# Patient Record
Sex: Female | Born: 1980 | Race: Black or African American | Hispanic: No | Marital: Single | Smoking: Current every day smoker
Health system: Southern US, Community
[De-identification: ages and names within clinical notes are randomized; demographics above are authoritative.]

## PROBLEM LIST (undated history)

## (undated) DIAGNOSIS — Z803 Family history of malignant neoplasm of breast: Secondary | ICD-10-CM

## (undated) DIAGNOSIS — D563 Thalassemia minor: Secondary | ICD-10-CM

## (undated) DIAGNOSIS — R519 Headache, unspecified: Secondary | ICD-10-CM

## (undated) DIAGNOSIS — Z800 Family history of malignant neoplasm of digestive organs: Secondary | ICD-10-CM

## (undated) DIAGNOSIS — J45909 Unspecified asthma, uncomplicated: Secondary | ICD-10-CM

## (undated) HISTORY — PX: DILATION AND CURETTAGE OF UTERUS: SHX78

## (undated) HISTORY — DX: Thalassemia minor: D56.3

## (undated) HISTORY — DX: Family history of malignant neoplasm of breast: Z80.3

## (undated) HISTORY — DX: Family history of malignant neoplasm of digestive organs: Z80.0

---

## 2016-08-11 ENCOUNTER — Emergency Department (HOSPITAL_COMMUNITY): Payer: Medicaid Other

## 2016-08-11 ENCOUNTER — Emergency Department (HOSPITAL_COMMUNITY)
Admission: EM | Admit: 2016-08-11 | Discharge: 2016-08-11 | Disposition: A | Discharge status: Home / Self Care | Payer: Medicaid Other | Attending: Emergency Medicine | Admitting: Emergency Medicine

## 2016-08-11 ENCOUNTER — Encounter (HOSPITAL_COMMUNITY): Payer: Self-pay | Admitting: Emergency Medicine

## 2016-08-11 DIAGNOSIS — J45909 Unspecified asthma, uncomplicated: Secondary | ICD-10-CM | POA: Diagnosis not present

## 2016-08-11 DIAGNOSIS — R0789 Other chest pain: Secondary | ICD-10-CM | POA: Diagnosis not present

## 2016-08-11 DIAGNOSIS — R079 Chest pain, unspecified: Secondary | ICD-10-CM | POA: Diagnosis present

## 2016-08-11 DIAGNOSIS — F17210 Nicotine dependence, cigarettes, uncomplicated: Secondary | ICD-10-CM | POA: Diagnosis not present

## 2016-08-11 HISTORY — DX: Unspecified asthma, uncomplicated: J45.909

## 2016-08-11 LAB — COMPREHENSIVE METABOLIC PANEL
ALK PHOS: 58 U/L (ref 38–126)
ALT: 13 U/L — AB (ref 14–54)
ANION GAP: 9 (ref 5–15)
AST: 17 U/L (ref 15–41)
Albumin: 4.1 g/dL (ref 3.5–5.0)
BILIRUBIN TOTAL: 0.3 mg/dL (ref 0.3–1.2)
BUN: 11 mg/dL (ref 6–20)
CHLORIDE: 111 mmol/L (ref 101–111)
CO2: 22 mmol/L (ref 22–32)
Calcium: 9.3 mg/dL (ref 8.9–10.3)
Creatinine, Ser: 0.93 mg/dL (ref 0.44–1.00)
GFR calc Af Amer: >60 mL/min (ref >60)
GFR calc non Af Amer: >60 mL/min (ref >60)
GLUCOSE: 98 mg/dL (ref 65–99)
POTASSIUM: 3.8 mmol/L (ref 3.5–5.1)
Sodium: 142 mmol/L (ref 135–145)
TOTAL PROTEIN: 7.4 g/dL (ref 6.5–8.1)

## 2016-08-11 LAB — CBC WITH DIFFERENTIAL/PLATELET
Basophils Absolute: 0 10*3/uL (ref 0.0–0.1)
Basophils Relative: 0 %
EOS PCT: 1 %
Eosinophils Absolute: 0.1 10*3/uL (ref 0.0–0.7)
HCT: 36.6 % (ref 36.0–46.0)
HEMOGLOBIN: 11.7 g/dL — AB (ref 12.0–15.0)
LYMPHS ABS: 2.5 10*3/uL (ref 0.7–4.0)
LYMPHS PCT: 36 %
MCH: 24 pg — AB (ref 26.0–34.0)
MCHC: 32 g/dL (ref 30.0–36.0)
MCV: 75 fL — AB (ref 78.0–100.0)
Monocytes Absolute: 0.5 10*3/uL (ref 0.1–1.0)
Monocytes Relative: 7 %
NEUTROS ABS: 3.8 10*3/uL (ref 1.7–7.7)
NEUTROS PCT: 56 %
Platelets: 267 10*3/uL (ref 150–400)
RBC: 4.88 MIL/uL (ref 3.87–5.11)
RDW: 15.3 % (ref 11.5–15.5)
WBC: 6.9 10*3/uL (ref 4.0–10.5)

## 2016-08-11 LAB — D-DIMER, QUANTITATIVE: D-Dimer, Quant: 0.48 ug/mL-FEU (ref 0.00–0.50)

## 2016-08-11 LAB — I-STAT TROPONIN, ED
Troponin i, poc: 0 ng/mL (ref 0.00–0.08)
Troponin i, poc: 0 ng/mL (ref 0.00–0.08)

## 2016-08-11 MED ORDER — IBUPROFEN 400 MG PO TABS: 400.0000 mg | ORAL_TABLET | Freq: Four times a day (QID) | ORAL | 0 refills | Status: DC | PRN

## 2016-08-11 MED ORDER — ACETAMINOPHEN 325 MG PO TABS
ORAL_TABLET | ORAL | Status: AC
  Administered 2016-08-11: 650 mg via ORAL
  Filled 2016-08-11: qty 2

## 2016-08-11 MED ORDER — ACETAMINOPHEN 325 MG PO TABS
650.0000 mg | ORAL_TABLET | Freq: Once | ORAL | Status: AC
  Administered 2016-08-11: 650 mg via ORAL

## 2016-08-11 MED ORDER — ACETAMINOPHEN 500 MG PO TABS: 500.0000 mg | ORAL_TABLET | Freq: Four times a day (QID) | ORAL | 0 refills | Status: DC | PRN

## 2016-08-11 NOTE — ED Notes (Signed)
Pt provided with d/c instructions at this time. Pt verbalizes understanding of d/c instructions as well as follow up procedure after d/c.  Pt provided with RX for tylenol and ibuprofen at this time.  Pt verbalizes understanding of RX directions. Pt in no apparent distress at this time. Pt ambulatory at time of d/c.

## 2016-08-11 NOTE — ED Triage Notes (Addendum)
Reports chest pain, shortness of breathe upon rest, pain worse with movement and deep inspiration, denies fevers. Smoker, on depo. All started yesterday. Has not had period since pregnant- 6 mos postpartum

## 2016-08-11 NOTE — ED Notes (Signed)
Pt's vitals updated and pt stated she was given Tylenol when she got here and she said that it didn't help. Pt is breast feeding. RN made aware that meds didn't help

## 2016-08-11 NOTE — ED Provider Notes (Signed)
[facility] Provider Note   CSN: [id number] Arrival date & time: 08/11/16  1355     History   Chief Complaint Chief Complaint  Patient presents with  . Chest Pain    HPI Nicole Lester is a 35 y.o. female.  HPI   Chest hurts every time breath in, it hurts severely.  Started day before yesterday. At first thought it was pulled muscle, slept wrong. Tightness with breathing from right side to back. Made sure to stretch, laid on other side the next night and woke up and it was worse.  Couldn't sit down, lay down, whole right side worse.   PPD today  Past Medical History:  Diagnosis Date  . Asthma     There are no active problems to display for this patient.   History reviewed. No pertinent surgical history.  OB History    No data available       Home Medications    Prior to Admission medications   Medication Sig Start Date End Date Taking? Authorizing Provider  MedroxyPROGESTERone Acetate 150 MG/ML SUSY Inject 150 mg as directed every 3 (three) months.  05/19/16  Yes Historical Provider, MD  Multiple Vitamin (MULTIVITAMIN) tablet Take 1 tablet by mouth daily.   Yes Historical Provider, MD  acetaminophen (TYLENOL) 500 MG tablet Take 1 tablet (500 mg total) by mouth every 6 (six) hours as needed. 08/11/16   Alvira MondayErin Ivianna Notch, MD  ibuprofen (ADVIL,MOTRIN) 400 MG tablet Take 1 tablet (400 mg total) by mouth every 6 (six) hours as needed. 08/11/16   Alvira MondayErin [place] Doig, MD    Family History History reviewed. No pertinent family history.  Social History Social History  Substance Use Topics  . Smoking status: Current Every Day Smoker    Packs/day: 0.50    Types: Cigarettes  . Smokeless tobacco: Not on file  . Alcohol use Not on file     Allergies   Eggs or egg-derived products and Bactrim [sulfamethoxazole-trimethoprim]   Review of Systems Review of Systems  Constitutional: Negative for appetite change and fever.  HENT: Negative for sore throat.   Eyes:  Negative for visual disturbance.  Respiratory: Negative for cough and shortness of breath (pain with breathing).   Cardiovascular: Positive for chest pain. Negative for leg swelling.  Gastrointestinal: Negative for abdominal pain, nausea and vomiting.  Genitourinary: Negative for difficulty urinating and dysuria.  Musculoskeletal: Negative for back pain and neck pain.  Skin: Negative for rash.  Neurological: Positive for light-headedness. Negative for syncope and headaches.     Physical Exam Updated Vital Signs BP 131/98 (BP Location: Right Arm)   Pulse 77   Temp 98.8 F (37.1 C)   Resp 18   SpO2 100%   Physical Exam  Constitutional: She is oriented to person, place, and time. She appears well-developed and well-nourished. No distress.  HENT:  Head: Normocephalic and atraumatic.  Eyes: Conjunctivae and EOM are normal.  Neck: Normal range of motion.  Cardiovascular: Normal rate, regular rhythm, normal heart sounds and intact distal pulses.  Exam reveals no gallop and no friction rub.   No murmur heard. Pulmonary/Chest: Effort normal and breath sounds normal. No respiratory distress. She has no wheezes. She has no rales. She exhibits tenderness (right chest wall).  Abdominal: Soft. She exhibits no distension. There is no tenderness. There is no guarding.  Musculoskeletal: She exhibits no edema or tenderness.  Neurological: She is alert and oriented to person, place, and time.  Skin: Skin is warm and dry. No rash  noted. She is not diaphoretic. No erythema.  Nursing note and vitals reviewed.    ED Treatments / Results  Labs (all labs ordered are listed, but only abnormal results are displayed) Labs Reviewed  CBC WITH DIFFERENTIAL/PLATELET - Abnormal; Notable for the following:       Result Value   Hemoglobin 11.7 (*)    MCV 75.0 (*)    MCH 24.0 (*)    All other components within normal limits  COMPREHENSIVE METABOLIC PANEL - Abnormal; Notable for the following:    ALT 13  (*)    All other components within normal limits  D-DIMER, QUANTITATIVE (NOT AT [facility])  I-STAT TROPOININ, ED  I-STAT TROPOININ, ED    EKG  EKG Interpretation  Date/Time:  Tuesday August 11 2016 14:07:57 EDT Ventricular Rate:  111 PR Interval:  144 QRS Duration: 74 QT Interval:  314 QTC Calculation: 427 R Axis:   50 Text Interpretation:  Sinus tachycardia T wave abnormality, consider inferior ischemia Abnormal ECG No previous ECGs available Confirmed by [facility] MD, Caidon Foti ([id number]) on 08/11/2016 9:57:22 PM       Radiology Dg Chest 2 View  Result Date: 08/11/2016 CLINICAL DATA:  Right-sided chest pain and shortness of breath. Productive cough over the last 2 days. EXAM: CHEST  2 VIEW COMPARISON:  None. FINDINGS: Heart size is normal. Mediastinal shadows are normal. The lungs are clear. No bronchial thickening. No infiltrate, mass, effusion or collapse. Pulmonary vascularity is normal. No bony abnormality. IMPRESSION: Normal chest Electronically Signed   By: Paulina Fusi M.D.   On: 08/11/2016 15:36    Procedures Procedures (including critical care time)  Medications Ordered in ED Medications  acetaminophen (TYLENOL) tablet 650 mg (650 mg Oral Given 08/11/16 1700)     Initial Impression / Assessment and Plan / ED Course  I have reviewed the triage vital signs and the nursing notes.  Pertinent labs & imaging results that were available during my care of the patient were reviewed by me and considered in my medical decision making (see chart for details).  Clinical Course   35 year old female with history of asthma, smoking presents with concern of chest pain. Differential diagnosis for chest pain includes pulmonary embolus, dissection, pneumothorax, pneumonia, ACS, myocarditis, pericarditis.  EKG was done and evaluate by me and showed no acute ST changes and no signs of pericarditis. Chest x-ray was done and evaluated by me and radiology and showed no sign of pneumonia or  pneumothorax. Patient is low risk Wells with negative ddimer and have low suspicion for PE. Heart rate improved after triage.  Patient is low risk HEART score and had delta troponins which were both negative. Given this evaluation, history and physical have low suspicion for pulmonary embolus, pneumonia, ACS, myocarditis, pericarditis, dissection.   Patient most likely with musculoskeletal chest pain given pain with palpation. Recommend ibuprofen/tylenol.  Patient discharged in stable condition with understanding of reasons to return and recommend PCP follow up.   Final Clinical Impressions(s) / ED Diagnoses   Final diagnoses:  Musculoskeletal chest pain    New Prescriptions Discharge Medication List as of 08/11/2016 10:57 PM    START taking these medications   Details  ibuprofen (ADVIL,MOTRIN) 400 MG tablet Take 1 tablet (400 mg total) by mouth every 6 (six) hours as needed., Starting Tue 08/11/2016, Print         Alvira Monday, MD 08/12/16 1445

## 2017-01-11 ENCOUNTER — Encounter (HOSPITAL_COMMUNITY): Payer: Self-pay | Admitting: Emergency Medicine

## 2017-01-11 ENCOUNTER — Emergency Department (HOSPITAL_COMMUNITY)
Admission: EM | Admit: 2017-01-11 | Discharge: 2017-01-12 | Disposition: A | Discharge status: Home / Self Care | Payer: Medicaid Other | Attending: Emergency Medicine | Admitting: Emergency Medicine

## 2017-01-11 DIAGNOSIS — F17210 Nicotine dependence, cigarettes, uncomplicated: Secondary | ICD-10-CM | POA: Diagnosis not present

## 2017-01-11 DIAGNOSIS — R591 Generalized enlarged lymph nodes: Secondary | ICD-10-CM

## 2017-01-11 DIAGNOSIS — J45909 Unspecified asthma, uncomplicated: Secondary | ICD-10-CM | POA: Diagnosis not present

## 2017-01-11 MED ORDER — IBUPROFEN 400 MG PO TABS: 400.0000 mg | ORAL_TABLET | Freq: Once | ORAL | Status: DC | PRN

## 2017-01-11 MED ORDER — IBUPROFEN 100 MG/5ML PO SUSP
ORAL | Status: AC
  Filled 2017-01-11: qty 20

## 2017-01-11 MED ORDER — IBUPROFEN 100 MG/5ML PO SUSP
400.0000 mg | Freq: Once | ORAL | Status: AC
  Administered 2017-01-11: 400 mg via ORAL

## 2017-01-11 NOTE — ED Triage Notes (Signed)
Pt presents with swelling noted to Left neck near the ear; pt states she noticed it when she got home from work; pt states it is very tender and painful and it is warm to touch; no drainage noted

## 2017-01-12 NOTE — ED Provider Notes (Signed)
[facility] Provider Note   CSN: [id number] Arrival date & time: 01/11/17  2132  By signing my name below, I, Linna DarnerRussell Turner, attest that this documentation has been prepared under the direction and in the presence of Felicie Mornavid Con Arganbright, NP. Electronically Signed: Linna Darnerussell Turner, Scribe. 01/12/2017. 12:02 AM.  History   Chief Complaint Chief Complaint  Patient presents with  . Lymphadenopathy    The history is provided by the patient. No language interpreter was used.     HPI Comments: Rogelio SeenShanisha Natal is a 36 y.o. female with PMHx of asthma who presents to the Emergency Department complaining of a swollen left-sided lymph node beginning around 4 PM yesterday afternoon. She states that the area has gotten larger and gradually more painful since onset. She endorses pain exacerbation with palpation to the area. Pt received Motrin here with some improvement of her pain. She denies any recent cold-like symptoms including cough, congestion, and rhinorrhea. Pt has no other complaints at this time. She does not have a PCP here as [place]   Past Medical History:  Diagnosis Date  . Asthma     There are no active problems to display for this patient.   History reviewed. No pertinent surgical history.  OB History    No data available       Home Medications    Prior to Admission medications   Medication Sig Start Date End Date Taking? Authorizing Provider  acetaminophen (TYLENOL) 500 MG tablet Take 1 tablet (500 mg total) by mouth every 6 (six) hours as needed. 08/11/16   Alvira MondayErin Schlossman, MD  ibuprofen (ADVIL,MOTRIN) 400 MG tablet Take 1 tablet (400 mg total) by mouth every 6 (six) hours as needed. 08/11/16   Alvira MondayErin Schlossman, MD  MedroxyPROGESTERone Acetate 150 MG/ML SUSY Inject 150 mg as directed every 3 (three) months.  05/19/16   Historical Provider, MD  Multiple Vitamin (MULTIVITAMIN) tablet Take 1 tablet by mouth daily.    Historical  Provider, MD    Family History History reviewed. No pertinent family history.  Social History Social History  Substance Use Topics  . Smoking status: Current Every Day Smoker    Packs/day: 0.20    Types: Cigarettes  . Smokeless tobacco: Never Used  . Alcohol use No     Allergies   Eggs or egg-derived products and Bactrim [sulfamethoxazole-trimethoprim]   Review of Systems Review of Systems  HENT: Negative for congestion and rhinorrhea.   Respiratory: Negative for cough.   Hematological: Positive for adenopathy.  All other systems reviewed and are negative.    Physical Exam Updated Vital Signs BP 141/91 (BP Location: Right Arm)   Pulse 103   Temp 99.4 F (37.4 C) (Oral)   Resp 20   Ht 5\' 4"  (1.626 m)   SpO2 100%   Physical Exam  Constitutional: She is oriented to person, place, and time. She appears well-developed and well-nourished. No distress.  HENT:  Head: Normocephalic and atraumatic.  Right Ear: Tympanic membrane normal.  Left Ear: Tympanic membrane is erythematous.  Left TM mildly erythematous.  Eyes: Conjunctivae and EOM are normal.  Neck: Neck supple. No tracheal deviation present.  Cardiovascular: Normal rate.   Pulmonary/Chest: Effort normal. No respiratory distress.  Musculoskeletal: Normal range of motion.  Lymphadenopathy:       Head (left side): Posterior auricular adenopathy present.  Neurological: She is alert and oriented to person, place, and time.  Skin: Skin is warm and dry.  Psychiatric:  She has a normal mood and affect. Her behavior is normal.  Nursing note and vitals reviewed.    ED Treatments / Results  Labs (all labs ordered are listed, but only abnormal results are displayed) Labs Reviewed - No data to display  EKG  EKG Interpretation None       Radiology No results found.  Procedures Procedures (including critical care time)  DIAGNOSTIC STUDIES: Oxygen Saturation is 100% on RA, normal by my interpretation.     COORDINATION OF CARE: 12:09 AM Discussed treatment plan with pt at bedside and pt agreed to plan.  Medications Ordered in ED Medications  ibuprofen (ADVIL,MOTRIN) 100 MG/5ML suspension (not administered)  ibuprofen (ADVIL,MOTRIN) 100 MG/5ML suspension 400 mg (400 mg Oral Given 01/11/17 2226)     Initial Impression / Assessment and Plan / ED Course  I have reviewed the triage vital signs and the nursing notes.  Pertinent labs & imaging results that were available during my care of the patient were reviewed by me and considered in my medical decision making (see chart for details).    Patient with post-auricular lymph node enlargement. Mild erythema of left TM noted.  No evidence of cellulitis.  Symptomatic care instructions provided.  Return precautions discussed.  Final Clinical Impressions(s) / ED Diagnoses   Final diagnoses:  Lymphadenopathy    New Prescriptions Discharge Medication List as of 01/12/2017 12:25 AM     I personally performed the services described in this documentation, which was scribed in my presence. The recorded information has been reviewed and is accurate.    Felicie Morn, NP 01/12/17 1610    Shon Baton, MD 01/12/17 [phone]

## 2017-06-29 IMAGING — DX DG CHEST 2V
2 series · 2 of 2 positions shown · non-contrast
Comparison: None.

CLINICAL DATA: Right-sided chest pain and shortness of breath.
Productive cough over the last 2 days.

EXAM:
CHEST  2 VIEW

[chest pa]
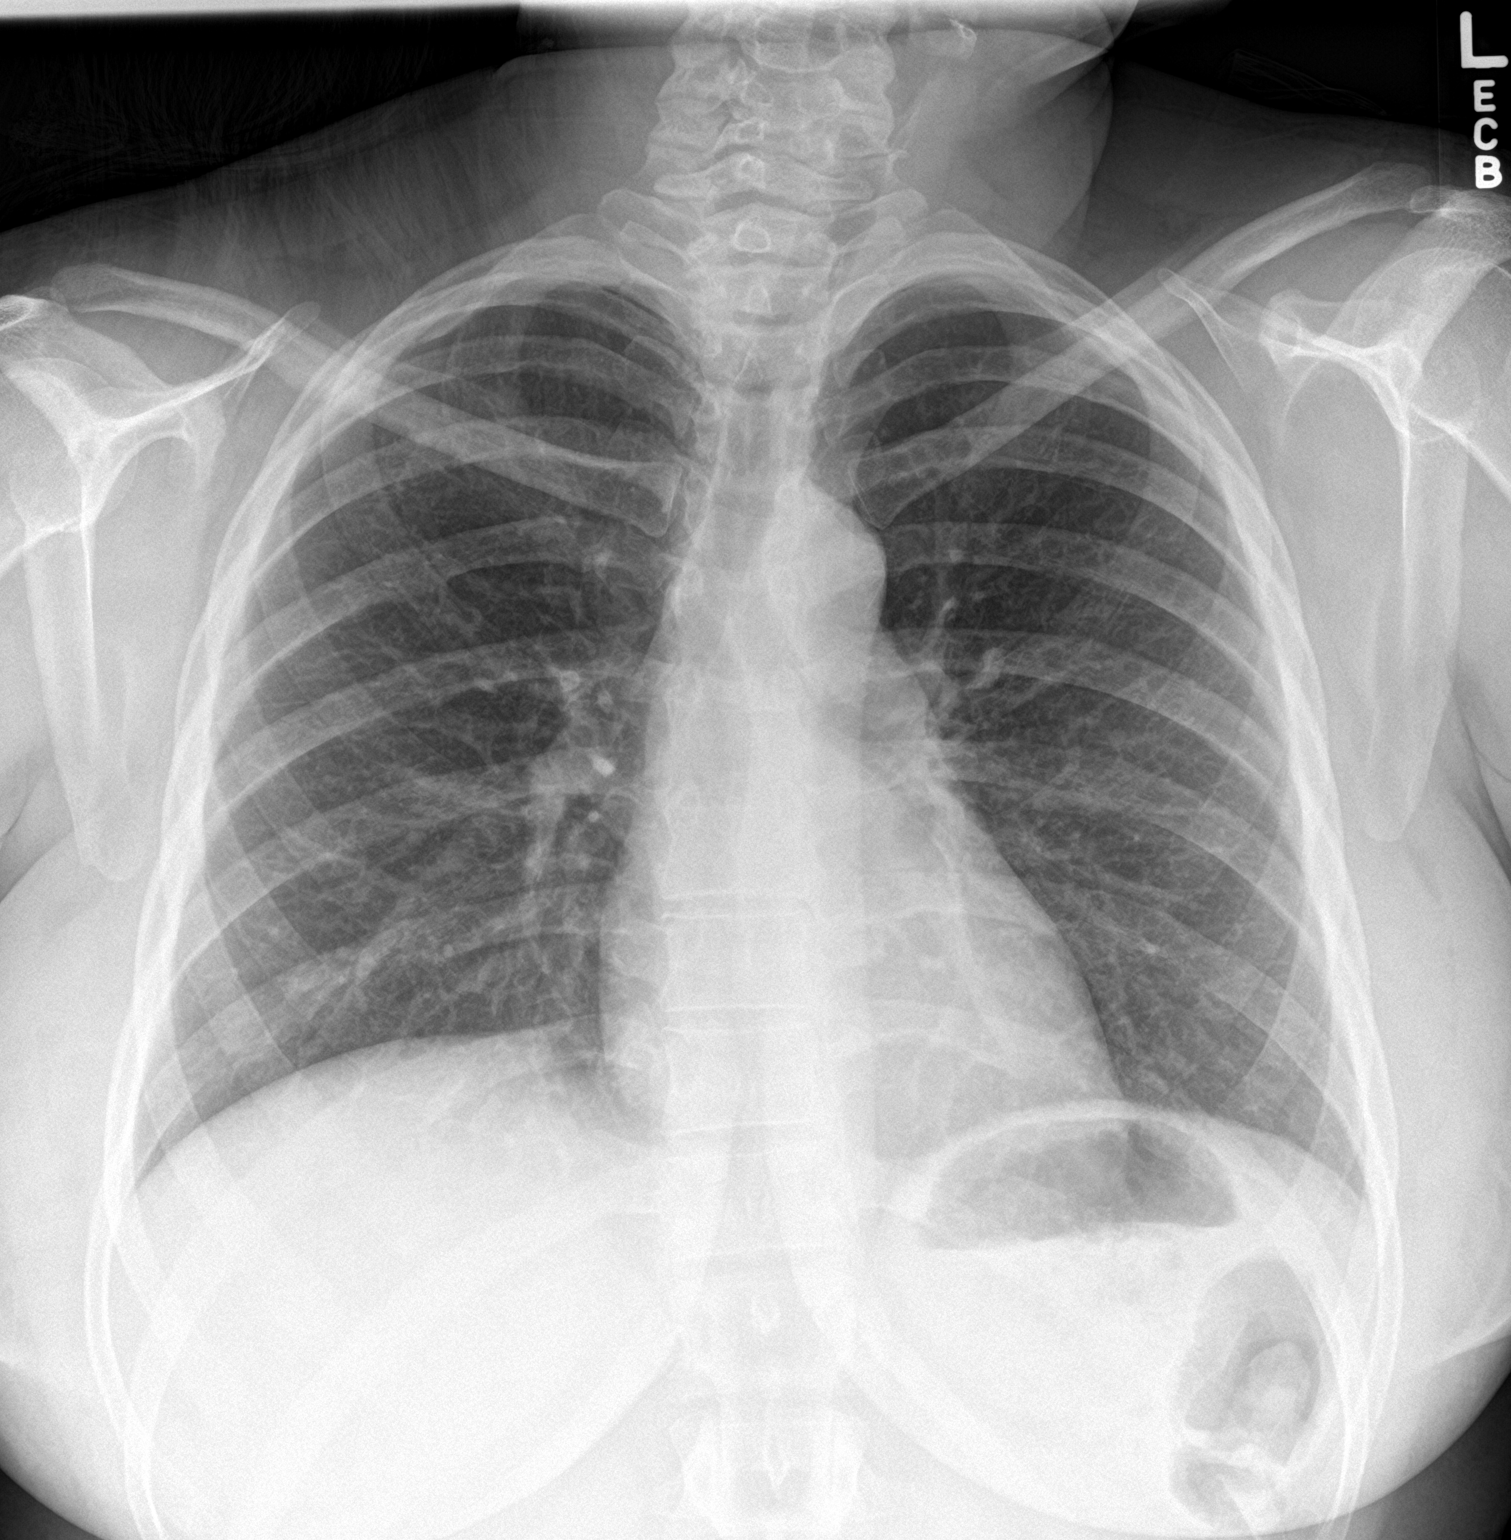

[chest lat]
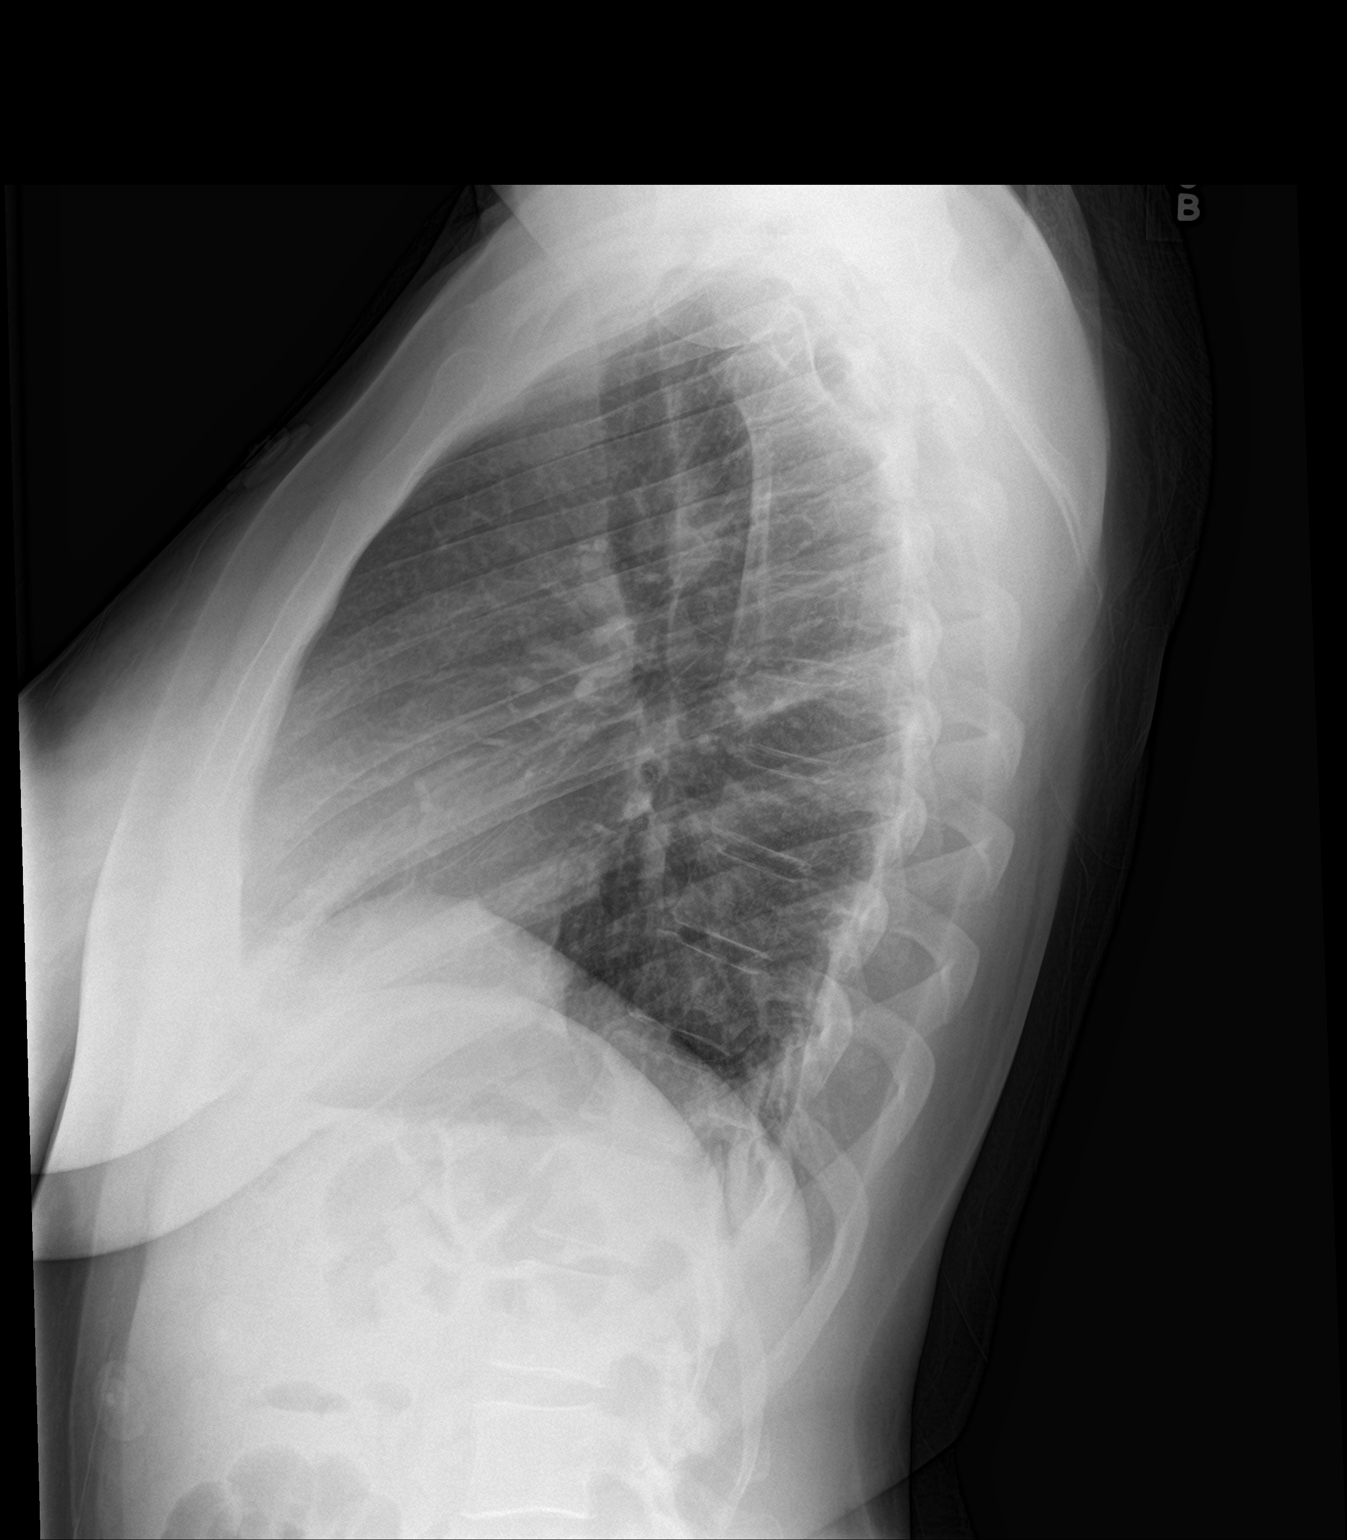

[2 of 2 positions shown; findings below may reference images not displayed]

FINDINGS: Heart size is normal. Mediastinal shadows are normal. The lungs are
clear. No bronchial thickening. No infiltrate, mass, effusion or
collapse. Pulmonary vascularity is normal. No bony abnormality.
IMPRESSION: Normal chest

## 2017-11-03 ENCOUNTER — Other Ambulatory Visit: Payer: Self-pay | Admitting: *Deleted

## 2017-11-03 DIAGNOSIS — N644 Mastodynia: Secondary | ICD-10-CM

## 2017-11-03 DIAGNOSIS — N63 Unspecified lump in unspecified breast: Secondary | ICD-10-CM

## 2021-01-02 ENCOUNTER — Encounter (HOSPITAL_COMMUNITY): Payer: Self-pay

## 2021-01-02 ENCOUNTER — Other Ambulatory Visit: Payer: Self-pay

## 2021-01-02 ENCOUNTER — Ambulatory Visit (HOSPITAL_COMMUNITY)
Admission: EM | Admit: 2021-01-02 | Discharge: 2021-01-02 | Disposition: A | Discharge status: Home / Self Care | Payer: HRSA Program | Attending: Emergency Medicine | Admitting: Emergency Medicine

## 2021-01-02 DIAGNOSIS — Z20822 Contact with and (suspected) exposure to covid-19: Secondary | ICD-10-CM | POA: Insufficient documentation

## 2021-01-02 LAB — SARS CORONAVIRUS 2 (TAT 6-24 HRS): SARS Coronavirus 2: NEGATIVE

## 2021-01-02 NOTE — ED Triage Notes (Signed)
Pt presents for COVID testing. Pt denies fever, shortness of breath, cough, or any other symptoms.    

## 2023-07-15 ENCOUNTER — Encounter: Payer: Self-pay | Admitting: Obstetrics and Gynecology

## 2023-07-15 ENCOUNTER — Encounter: Payer: Medicaid Other | Admitting: Obstetrics and Gynecology

## 2023-07-15 ENCOUNTER — Ambulatory Visit (INDEPENDENT_AMBULATORY_CARE_PROVIDER_SITE_OTHER): Payer: Medicaid Other | Admitting: Obstetrics and Gynecology

## 2023-07-15 VITALS — BP 146/97 | HR 86 | Ht 64.0 in | Wt 171.2 lb

## 2023-07-15 DIAGNOSIS — Z1239 Encounter for other screening for malignant neoplasm of breast: Secondary | ICD-10-CM

## 2023-07-15 DIAGNOSIS — R030 Elevated blood-pressure reading, without diagnosis of hypertension: Secondary | ICD-10-CM

## 2023-07-15 DIAGNOSIS — N920 Excessive and frequent menstruation with regular cycle: Secondary | ICD-10-CM | POA: Diagnosis not present

## 2023-07-15 DIAGNOSIS — Z124 Encounter for screening for malignant neoplasm of cervix: Secondary | ICD-10-CM | POA: Diagnosis not present

## 2023-07-15 DIAGNOSIS — N946 Dysmenorrhea, unspecified: Secondary | ICD-10-CM

## 2023-07-15 NOTE — Progress Notes (Signed)
Obstetrics and Gynecology New Patient Evaluation  Appointment Date: 07/18/2023  OBGYN Clinic: [facility] for [facility]  Primary Care Provider: [facility], [place] [facility]  Chief Complaint: long heavy and painful menstrual bleeding  History of Present Illness: Nicole Lester is a 42 y.o.  [phone] (Patient's last menstrual period was LMP 8/6 and 05/31/2023 (approximate).), seen for the above chief complaint. Her past medical history is significant for transient hypertension, tobacco, BMI 30s.   Patient states that starting around December 2021 and went from a few days in the beginning where it was heavy and painful to now it's her entire period; period still lasts about a week. Patient has used various methods to help in the past with depo notable for causing weight gain. She denies any intermenstrual bleeding or pain outside of the periods.   Patient seen by PCP on 04/29/2023 for annual visit and she had a pap smear where the HPV was negative but I don't see cytology results when patient pulls up EMR on her phone. She had negative GC/CT/trich, rpr, hiv, hepC and CBC with hemoglobin of 11.3  Review of Systems: Pertinent items noted in HPI and remainder of comprehensive ROS otherwise negative.   Patient Active Problem List   Diagnosis Date Noted   Dysmenorrhea 07/18/2023   Menorrhagia with regular cycle 07/18/2023   Transient hypertension 07/18/2023    Past Medical History:  Past Medical History:  Diagnosis Date   Asthma    Beta thalassemia trait     Past Surgical History:  No past surgical history on file.  Past Obstetrical History:  OB History  Gravida Para Term Preterm AB Living  10 3 3     3   SAB IAB Ectopic Multiple Live Births          3    # Outcome Date GA Lbr Len/2nd Weight Sex Type Anes PTL Lv  10 Gravida           9 Gravida           8 Gravida           7 Gravida           6 Gravida           5 Gravida            4 Gravida           3 Term           2 Term           1 Term             Obstetric Comments  SVD x 3    Past Gynecological History: As per HPI. She is currently using no method for contraception.   Social History:  Social History   Socioeconomic History   Marital status: Married    Spouse name: Not on file   Number of children: Not on file   Years of education: Not on file   Highest education level: Not on file  Occupational History   Not on file  Tobacco Use   Smoking status: Every Day    Current packs/day: 0.20    Types: Cigarettes   Smokeless tobacco: Never  Substance and Sexual Activity   Alcohol use: No   Drug use: No   Sexual activity: Not on file  Other Topics Concern   Not on file  Social History Narrative   Not on file   Social  Determinants of Health   Financial Resource Strain: Not on File (07/11/2018)   Received from [employer]    Financial Resource Strain: 0  Food Insecurity: Not on File (07/11/2018)   Received from [employer]    Food: 0  Transportation Needs: Not on File (07/11/2018)   Received from [employer] [facility]    Transportation: 0  Physical Activity: Not on File (07/11/2018)   Received from [facility]   Physical Activity    Physical Activity: 0  Stress: Not on File (07/11/2018)   Received from [facility]   Stress    Stress: 0  Social Connections: Not on File (07/11/2018)   Received from [facility]   Social Connections    Social Connections and Isolation: 0  Intimate Partner Violence: Not on file    Family History: No family history on file.  Medications Daphanie Sitton "[name]" had no medications administered during this visit. Current Outpatient Medications  Medication Sig Dispense Refill   acetaminophen (TYLENOL) 500 MG tablet Take 1 tablet (500 mg total) by mouth every 6 (six) hours as needed. 30 tablet 0   cetirizine (ZYRTEC) 10 MG tablet Take 10 mg by mouth daily.     ibuprofen  (ADVIL,MOTRIN) 400 MG tablet Take 1 tablet (400 mg total) by mouth every 6 (six) hours as needed. 30 tablet 0   Multiple Vitamin (MULTIVITAMIN) tablet Take 1 tablet by mouth daily.     MedroxyPROGESTERone Acetate 150 MG/ML SUSY Inject 150 mg as directed every 3 (three) months.      No current facility-administered medications for this visit.    Allergies Egg-derived products, Bactrim [sulfamethoxazole-trimethoprim], and Boric acid   Physical Exam:  BP (!) 146/97   Pulse 86   Ht 5\' 4"  (1.626 m)   Wt 171 lb 3.2 oz (77.7 kg)   LMP 05/31/2023 (Approximate)   BMI 29.39 kg/m  Body mass index is 29.39 kg/m. General appearance: Well nourished, well developed female in no acute distress.  Neck:  Supple, normal appearance, and no thyromegaly  Cardiovascular: normal s1 and s2.  No murmurs, rubs or gallops. Respiratory:  Clear to auscultation bilateral. Normal respiratory effort Abdomen: positive bowel sounds and no masses, hernias; diffusely non tender to palpation, non distended Neuro/Psych:  Normal mood and affect.  Skin:  Warm and dry.   Laboratory: as per HPI  Radiology: no new imaging  Assessment: patient stable  Plan:  1. Cervical cancer screening May need to repeat pap smear to get cytology  2. Breast screening - MM 3D SCREENING MAMMOGRAM BILATERAL BREAST; Future  3. Transient hypertension  4. Dysmenorrhea Various options, tentatively d/w her, including cyclic Lysteda in the interim, but declines this and is already wanting surgical intervention, most likely hysterectomy. Will follow up with her after pelvic u/s - US PELVIC COMPLETE WITH TRANSVAGINAL; Future  5. Menorrhagia with regular cycle - US PELVIC COMPLETE WITH TRANSVAGINAL; Future   RTC PRN  Return in about 6 weeks (around 08/26/2023) for sometime after u/s is done for pelvic exam , in person, with dr Vergie Living.  Future Appointments  Date Time Provider Department Center  08/06/2023  1:00 PM [facility]     [place] Bing, Montez Hageman MD Attending Center for [facility])

## 2023-07-18 ENCOUNTER — Encounter: Payer: Self-pay | Admitting: Obstetrics and Gynecology

## 2023-07-18 DIAGNOSIS — N920 Excessive and frequent menstruation with regular cycle: Secondary | ICD-10-CM | POA: Insufficient documentation

## 2023-07-18 DIAGNOSIS — R030 Elevated blood-pressure reading, without diagnosis of hypertension: Secondary | ICD-10-CM | POA: Insufficient documentation

## 2023-07-18 DIAGNOSIS — N946 Dysmenorrhea, unspecified: Secondary | ICD-10-CM | POA: Insufficient documentation

## 2023-07-27 ENCOUNTER — Other Ambulatory Visit: Payer: Self-pay | Admitting: Obstetrics and Gynecology

## 2023-07-27 DIAGNOSIS — Z1231 Encounter for screening mammogram for malignant neoplasm of breast: Secondary | ICD-10-CM

## 2023-07-29 ENCOUNTER — Encounter: Payer: Medicaid Other | Admitting: Obstetrics and Gynecology

## 2023-07-30 ENCOUNTER — Ambulatory Visit (HOSPITAL_BASED_OUTPATIENT_CLINIC_OR_DEPARTMENT_OTHER)
Admission: RE | Admit: 2023-07-30 | Discharge: 2023-07-30 | Disposition: A | Discharge status: Home / Self Care | Payer: Medicaid Other | Source: Ambulatory Visit | Attending: Obstetrics and Gynecology

## 2023-07-30 DIAGNOSIS — N920 Excessive and frequent menstruation with regular cycle: Secondary | ICD-10-CM | POA: Diagnosis present

## 2023-07-30 DIAGNOSIS — N946 Dysmenorrhea, unspecified: Secondary | ICD-10-CM | POA: Insufficient documentation

## 2023-08-06 ENCOUNTER — Ambulatory Visit (HOSPITAL_BASED_OUTPATIENT_CLINIC_OR_DEPARTMENT_OTHER): Payer: Medicaid Other

## 2023-08-19 ENCOUNTER — Encounter: Payer: Self-pay | Admitting: Obstetrics and Gynecology

## 2023-08-20 ENCOUNTER — Ambulatory Visit
Admission: RE | Admit: 2023-08-20 | Discharge: 2023-08-20 | Disposition: A | Discharge status: Home / Self Care | Payer: Medicaid Other | Source: Ambulatory Visit

## 2023-08-20 ENCOUNTER — Other Ambulatory Visit: Payer: Self-pay | Admitting: Obstetrics and Gynecology

## 2023-08-20 DIAGNOSIS — Z1231 Encounter for screening mammogram for malignant neoplasm of breast: Secondary | ICD-10-CM

## 2023-09-17 ENCOUNTER — Other Ambulatory Visit (HOSPITAL_COMMUNITY)
Admission: RE | Admit: 2023-09-17 | Discharge: 2023-09-17 | Disposition: A | Discharge status: Home / Self Care | Payer: Medicaid Other | Source: Ambulatory Visit | Attending: Obstetrics and Gynecology | Admitting: Obstetrics and Gynecology

## 2023-09-17 ENCOUNTER — Ambulatory Visit (INDEPENDENT_AMBULATORY_CARE_PROVIDER_SITE_OTHER): Payer: Medicaid Other | Admitting: Obstetrics and Gynecology

## 2023-09-17 ENCOUNTER — Encounter: Payer: Self-pay | Admitting: Obstetrics and Gynecology

## 2023-09-17 ENCOUNTER — Other Ambulatory Visit: Payer: Self-pay

## 2023-09-17 VITALS — BP 138/98 | HR 93 | Wt 171.3 lb

## 2023-09-17 DIAGNOSIS — R030 Elevated blood-pressure reading, without diagnosis of hypertension: Secondary | ICD-10-CM

## 2023-09-17 DIAGNOSIS — A64 Unspecified sexually transmitted disease: Secondary | ICD-10-CM | POA: Diagnosis not present

## 2023-09-17 DIAGNOSIS — N920 Excessive and frequent menstruation with regular cycle: Secondary | ICD-10-CM | POA: Insufficient documentation

## 2023-09-17 DIAGNOSIS — Z1331 Encounter for screening for depression: Secondary | ICD-10-CM | POA: Diagnosis not present

## 2023-09-17 DIAGNOSIS — N946 Dysmenorrhea, unspecified: Secondary | ICD-10-CM | POA: Diagnosis not present

## 2023-09-17 NOTE — Progress Notes (Unsigned)
Obstetrics and Gynecology Visit Return Patient Evaluation  Appointment Date: 09/17/2023  Primary Care Provider: [facility], [place]  OBGYN Clinic: [facility]  Chief Complaint: follow up heavy and painful periods  History of Present Illness:  Nicole Lester is a 42 y.o. [phone] (Patient's last menstrual period was LMP 8/6 and 05/31/2023 (approximate).), seen for the above chief complaint. Her past medical history is significant for hypertension, tobacco, BMI 30s.   Patient seen on 8/22 for new patient visit for heavy and painful periods.   Patient states that starting around December 2021 and went from a few days in the beginning where it was heavy and painful to now it's her entire period; period still lasts about a week. Patient has used various methods to help in the past with depo notable for causing weight gain. She denies any intermenstrual bleeding or pain outside of the periods.    Patient seen by PCP on 04/29/2023 for annual visit and she had a pap smear where the HPV was negative but I don't see cytology results when patient pulls up EMR on her phone. She had negative GC/CT/trich, rpr, hiv, hepC and CBC with hemoglobin of 11.3  Ultrasound ordered and pt to follow up today  Interval History: Since that time, she states that she got depo provera on 10/10 with her PCP to see if that would help; she states that she has been amenorrheic since then. LMP 9/22-25. She would like hcg and STD testing today. 9/6 u/s was negative.   Review of Systems: as noted in the History of Present Illness.  Patient Active Problem List   Diagnosis Date Noted   Dysmenorrhea 07/18/2023   Menorrhagia with regular cycle 07/18/2023   Transient hypertension 07/18/2023   Medications:  Nicole Lester "Nicole Lester" had no medications administered during this visit. Current Outpatient Medications  Medication Sig Dispense Refill   medroxyPROGESTERone  (DEPO-PROVERA) 150 MG/ML injection Inject 150 mg into the muscle every 3 (three) months.     No current facility-administered medications for this visit.    Allergies: is allergic to egg-derived products, bactrim [sulfamethoxazole-trimethoprim], and boric acid.  Physical Exam:  BP (!) 138/98   Pulse 93   Wt 171 lb 4.8 oz (77.7 kg)   BMI 29.40 kg/m  Body mass index is 29.4 kg/m. General appearance: Well nourished, well developed female in no acute distress.  Abdomen: diffusely non tender to palpation, non distended, and no masses, hernias Neuro/Psych:  Normal mood and affect.    Pelvic exam:  Cervical exam performed in the presence of a chaperone EGBUS: wnl Vaginal vault: wnl Cervix:  wnl Bimanual: negative  Radiology: Narrative & Impression  CLINICAL DATA:  heavy and painful periods   EXAM: ULTRASOUND OF PELVIS   TECHNIQUE: Transabdominal and transvaginalultrasound examination of the pelvis was performed including evaluation of the uterus, ovaries, adnexal regions, and pelvic cul-de-sac.   COMPARISON:  None Available.   FINDINGS: Uterusanteverted, 9 x 7 x 6 cm. The endometrium unremarkable, 1.1 cm. The uterine cavity is empty. There are no uterine masses.   Right ovary   Unremarkable, 3.8 x 3.4 x 1.4 cm.   Left ovary   Unremarkable, 3.6 x 3.5 x 2.1 cm.   Images of the adnexae demonstrated no masses or fluid collections. Ovaries demonstrate blood flow with color Doppler.   IMPRESSION: Unremarkable examination of the pelvis.     Electronically Signed   By: Layla Maw M.D.   On: 08/01/2023 19:57  Assessment: patient doing well  Plan:  1. Transient hypertension Recommend PCP follow up  2. Menorrhagia with regular cycle Options d/w her. She had previously been on depo provera for many years and it worked well but caused weight gain. Currently, no issues or weight gain on it. I d/w her re: surgical options such as endometrial ablation and  hysterectomy; birth control d/w her and she states that her partner has had a vasectomy. I told her re: r/b of both surgical options and that I'd recommend a TVH but patient is wanting to potentially leave her cervix if she has a hysterectomy; I told her studies hasn't shown a benefit to it but it can be done with just need to do a TLH and a mini-laparotomy for removal. I told her that I recommend f/u when next depo is due and if depo is doing well, to keep with doing that. If not and pt desires surgical option then can d/w her further between the two options. She is amenable to this because if she desires to have surgery she will need to wait until next year and will need to plan for help her post op.  - Cytology - PAP( [facility])  3. Dysmenorrhea - Cytology - PAP( [facility])  4. STD (sexually transmitted disease) - Beta hCG quant (ref lab) - RPR+HBsAg+HCVAb+...   Return in about 2 months (around 11/18/2023).  Cornelia Copa MD Attending Center for [employer])

## 2023-09-17 NOTE — Patient Instructions (Signed)
Novasure endometrial ablation

## 2023-09-18 LAB — RPR+HBSAG+HCVAB+...
HIV Screen 4th Generation wRfx: NONREACTIVE
Hep C Virus Ab: NONREACTIVE
Hepatitis B Surface Ag: NEGATIVE
RPR Ser Ql: NONREACTIVE

## 2023-09-18 LAB — BETA HCG QUANT (REF LAB): hCG Quant: <1 m[IU]/mL

## 2023-09-21 LAB — CYTOLOGY - PAP
Chlamydia: NEGATIVE
Comment: NEGATIVE
Comment: NEGATIVE
Comment: NEGATIVE
Comment: NORMAL
Diagnosis: NEGATIVE
High risk HPV: NEGATIVE
Neisseria Gonorrhea: NEGATIVE
Trichomonas: NEGATIVE

## 2023-10-04 ENCOUNTER — Encounter: Payer: Self-pay | Admitting: Obstetrics and Gynecology

## 2023-10-11 ENCOUNTER — Other Ambulatory Visit: Payer: Self-pay | Admitting: Obstetrics and Gynecology

## 2023-10-11 DIAGNOSIS — N946 Dysmenorrhea, unspecified: Secondary | ICD-10-CM

## 2023-10-11 DIAGNOSIS — N920 Excessive and frequent menstruation with regular cycle: Secondary | ICD-10-CM

## 2023-10-12 ENCOUNTER — Encounter: Payer: Self-pay | Admitting: Obstetrics and Gynecology

## 2023-10-26 ENCOUNTER — Telehealth: Payer: Self-pay

## 2023-10-26 NOTE — Telephone Encounter (Signed)
Called patient to offer surgery dates w/ Dr. Vergie Living. Patient expressed she needs to provide at least a 2 week notice to her employer. Patient was scheduled for 11/22/23 @1015  at [facility]. Provided Pre-Op instructions over the phone. Patient would like surgery details sent to her MyChart acct.

## 2023-11-07 ENCOUNTER — Other Ambulatory Visit: Payer: Self-pay | Admitting: Obstetrics and Gynecology

## 2023-11-07 DIAGNOSIS — Z01818 Encounter for other preprocedural examination: Secondary | ICD-10-CM

## 2023-11-18 ENCOUNTER — Encounter (HOSPITAL_COMMUNITY): Payer: Self-pay | Admitting: Obstetrics and Gynecology

## 2023-11-18 ENCOUNTER — Other Ambulatory Visit: Payer: Self-pay

## 2023-11-18 NOTE — Progress Notes (Signed)
PCP - [facility]  Cardiologist -   PPM/ICD - denies Device Orders - n/a Rep Notified - n/a  Chest x-ray - denies EKG - denies Stress Test - denies ECHO - denies Cardiac Cath - denies  CPAP - denies  DM - denies  Blood Thinner Instructions: denies Aspirin Instructions: n/a  ERAS Protcol - clear liquids until 7:35 am  COVID TEST- n/a  Anesthesia review: no  Patient verbally denies any shortness of breath, fever, cough and chest pain during phone call   -------------  SDW INSTRUCTIONS given:  Your procedure is scheduled on November 22, 2023.  Report to [facility] Entrance "A" at 8:05 A.M., and check in at the Admitting office.  Call this number if you have problems the morning of surgery:  [phone]   Remember:  Do not eat after midnight the night before your surgery  You may drink clear liquids until 7:35 the morning of your surgery.   Clear liquids allowed are: Water, Non-Citrus Juices (without pulp), Carbonated Beverages, Clear Tea, Black Coffee Only, and Gatorade    Take these medicines the morning of surgery with A SIP OF WATER  acetaminophen (TYLENOL)  cetirizine (ZYRTEC)   As of today, STOP taking any Aspirin (unless otherwise instructed by your surgeon) Aleve, Naproxen, Ibuprofen, Motrin, Advil, Goody's, BC's, all herbal medications, fish oil, and all vitamins.                      Do not wear jewelry, make up, or nail polish            Do not wear lotions, powders, perfumes/colognes, or deodorant.            Do not shave 48 hours prior to surgery.  Men may shave face and neck.            Do not bring valuables to the hospital.            [facility] is not responsible for any belongings or valuables.  Do NOT Smoke (Tobacco/Vaping) 24 hours prior to your procedure If you use a CPAP at night, you may bring all equipment for your overnight stay.   Contacts, glasses, dentures or bridgework may not be worn into surgery.      For patients  admitted to the hospital, discharge time will be determined by your treatment team.   Patients discharged the day of surgery will not be allowed to drive home, and someone needs to stay with them for 24 hours.    Special instructions:   [facility]- Preparing For Surgery  Before surgery, you can play an important role. Because skin is not sterile, your skin needs to be as free of germs as possible. You can reduce the number of germs on your skin by washing with CHG (chlorahexidine gluconate) Soap before surgery.  CHG is an antiseptic cleaner which kills germs and bonds with the skin to continue killing germs even after washing.    Oral Hygiene is also important to reduce your risk of infection.  Remember - BRUSH YOUR TEETH THE MORNING OF SURGERY WITH YOUR REGULAR TOOTHPASTE  Please do not use if you have an allergy to CHG or antibacterial soaps. If your skin becomes reddened/irritated stop using the CHG.  Do not shave (including legs and underarms) for at least 48 hours prior to first CHG shower. It is OK to shave your face.  Please follow these instructions carefully.   Shower the [employer]  BEFORE SURGERY and the MORNING OF SURGERY with DIAL Soap.   Pat yourself dry with a CLEAN TOWEL.  Wear CLEAN PAJAMAS to bed the night before surgery  Place CLEAN SHEETS on your bed the night of your first shower and DO NOT SLEEP WITH PETS.   Day of Surgery: Please shower morning of surgery  Wear Clean/Comfortable clothing the morning of surgery Do not apply any deodorants/lotions.   Remember to brush your teeth WITH YOUR REGULAR TOOTHPASTE.   Questions were answered. Patient verbalized understanding of instructions.

## 2023-11-22 ENCOUNTER — Other Ambulatory Visit: Payer: Self-pay

## 2023-11-22 ENCOUNTER — Encounter (HOSPITAL_COMMUNITY)
Admission: RE | Disposition: A | Discharge status: Home / Self Care | Payer: Self-pay | Source: Home / Self Care | Attending: Obstetrics and Gynecology

## 2023-11-22 ENCOUNTER — Telehealth: Payer: Self-pay | Admitting: Obstetrics and Gynecology

## 2023-11-22 ENCOUNTER — Ambulatory Visit (HOSPITAL_COMMUNITY)
Admission: RE | Admit: 2023-11-22 | Discharge: 2023-11-22 | Disposition: A | Discharge status: Home / Self Care | Payer: Medicaid Other | Attending: Obstetrics and Gynecology | Admitting: Obstetrics and Gynecology

## 2023-11-22 ENCOUNTER — Ambulatory Visit (HOSPITAL_COMMUNITY): Payer: Medicaid Other | Admitting: Anesthesiology

## 2023-11-22 ENCOUNTER — Encounter (HOSPITAL_COMMUNITY): Payer: Self-pay | Admitting: Obstetrics and Gynecology

## 2023-11-22 DIAGNOSIS — N946 Dysmenorrhea, unspecified: Secondary | ICD-10-CM | POA: Insufficient documentation

## 2023-11-22 DIAGNOSIS — I10 Essential (primary) hypertension: Secondary | ICD-10-CM | POA: Insufficient documentation

## 2023-11-22 DIAGNOSIS — Q5128 Other doubling of uterus, other specified: Secondary | ICD-10-CM | POA: Insufficient documentation

## 2023-11-22 DIAGNOSIS — Z9889 Other specified postprocedural states: Secondary | ICD-10-CM

## 2023-11-22 DIAGNOSIS — N920 Excessive and frequent menstruation with regular cycle: Secondary | ICD-10-CM | POA: Diagnosis not present

## 2023-11-22 DIAGNOSIS — F17210 Nicotine dependence, cigarettes, uncomplicated: Secondary | ICD-10-CM | POA: Diagnosis not present

## 2023-11-22 DIAGNOSIS — Z01818 Encounter for other preprocedural examination: Secondary | ICD-10-CM

## 2023-11-22 HISTORY — DX: Headache, unspecified: R51.9

## 2023-11-22 HISTORY — PX: DILITATION & CURRETTAGE/HYSTROSCOPY WITH NOVASURE ABLATION: SHX5568

## 2023-11-22 LAB — CBC
HCT: 35.6 % — ABNORMAL LOW (ref 36.0–46.0)
Hemoglobin: 11.2 g/dL — ABNORMAL LOW (ref 12.0–15.0)
MCH: 23.7 pg — ABNORMAL LOW (ref 26.0–34.0)
MCHC: 31.5 g/dL (ref 30.0–36.0)
MCV: 75.4 fL — ABNORMAL LOW (ref 80.0–100.0)
Platelets: 291 10*3/uL (ref 150–400)
RBC: 4.72 MIL/uL (ref 3.87–5.11)
RDW: 15.7 % — ABNORMAL HIGH (ref 11.5–15.5)
WBC: 4.8 10*3/uL (ref 4.0–10.5)
nRBC: 0 % (ref 0.0–0.2)

## 2023-11-22 LAB — ABO/RH: ABO/RH(D): A POS

## 2023-11-22 LAB — TYPE AND SCREEN
ABO/RH(D): A POS
Antibody Screen: NEGATIVE

## 2023-11-22 LAB — POCT PREGNANCY, URINE: Preg Test, Ur: NEGATIVE

## 2023-11-22 SURGERY — DILATATION & CURETTAGE/HYSTEROSCOPY WITH NOVASURE ABLATION
Anesthesia: General | Site: Vagina

## 2023-11-22 MED ORDER — DEXAMETHASONE SODIUM PHOSPHATE 10 MG/ML IJ SOLN
INTRAMUSCULAR | Status: DC | PRN
  Administered 2023-11-22: 4 mg via INTRAVENOUS

## 2023-11-22 MED ORDER — LACTATED RINGERS IV SOLN: INTRAVENOUS | Status: DC

## 2023-11-22 MED ORDER — HEATING PADS PADS: 1.0000 | MEDICATED_PAD | Status: AC | PRN

## 2023-11-22 MED ORDER — LIDOCAINE 2% (20 MG/ML) 5 ML SYRINGE
INTRAMUSCULAR | Status: DC | PRN
  Administered 2023-11-22: 50 mg via INTRAVENOUS

## 2023-11-22 MED ORDER — MIDAZOLAM HCL 2 MG/2ML IJ SOLN
INTRAMUSCULAR | Status: AC
  Filled 2023-11-22: qty 2

## 2023-11-22 MED ORDER — SILVER NITRATE-POT NITRATE 75-25 % EX MISC
CUTANEOUS | Status: DC | PRN
  Administered 2023-11-22: 2

## 2023-11-22 MED ORDER — FENTANYL CITRATE (PF) 250 MCG/5ML IJ SOLN
INTRAMUSCULAR | Status: DC | PRN
  Administered 2023-11-22: 50 ug via INTRAVENOUS

## 2023-11-22 MED ORDER — CHLORHEXIDINE GLUCONATE 0.12 % MT SOLN
15.0000 mL | Freq: Once | OROMUCOSAL | Status: AC
  Administered 2023-11-22: 15 mL via OROMUCOSAL
  Filled 2023-11-22: qty 15

## 2023-11-22 MED ORDER — FENTANYL CITRATE (PF) 250 MCG/5ML IJ SOLN
INTRAMUSCULAR | Status: AC
  Filled 2023-11-22: qty 5

## 2023-11-22 MED ORDER — MIDAZOLAM HCL 2 MG/2ML IJ SOLN
INTRAMUSCULAR | Status: DC | PRN
  Administered 2023-11-22: 2 mg via INTRAVENOUS

## 2023-11-22 MED ORDER — LIDOCAINE HCL (PF) 1 % IJ SOLN
INTRAMUSCULAR | Status: DC | PRN
  Administered 2023-11-22: 20 mL

## 2023-11-22 MED ORDER — ONDANSETRON HCL 4 MG/2ML IJ SOLN
INTRAMUSCULAR | Status: DC | PRN
  Administered 2023-11-22: 4 mg via INTRAVENOUS

## 2023-11-22 MED ORDER — SCOPOLAMINE 1 MG/3DAYS TD PT72
1.0000 | MEDICATED_PATCH | TRANSDERMAL | Status: DC
  Administered 2023-11-22: 1.5 mg via TRANSDERMAL
  Filled 2023-11-22: qty 1

## 2023-11-22 MED ORDER — ACETAMINOPHEN 500 MG PO TABS
1000.0000 mg | ORAL_TABLET | Freq: Once | ORAL | Status: AC
  Administered 2023-11-22: 1000 mg via ORAL
  Filled 2023-11-22: qty 2

## 2023-11-22 MED ORDER — PROPOFOL 10 MG/ML IV BOLUS
INTRAVENOUS | Status: DC | PRN
  Administered 2023-11-22: 75 mg via INTRAVENOUS
  Administered 2023-11-22: 200 mg via INTRAVENOUS

## 2023-11-22 MED ORDER — ORAL CARE MOUTH RINSE: 15.0000 mL | Freq: Once | OROMUCOSAL | Status: AC

## 2023-11-22 MED ORDER — PROPOFOL 10 MG/ML IV BOLUS
INTRAVENOUS | Status: AC
  Filled 2023-11-22: qty 20

## 2023-11-22 MED ORDER — IBUPROFEN 600 MG PO TABS: 600.0000 mg | ORAL_TABLET | Freq: Four times a day (QID) | ORAL | 0 refills | Status: DC | PRN

## 2023-11-22 SURGICAL SUPPLY — 14 items
CATH ROBINSON RED A/P 16FR (CATHETERS) ×1 IMPLANT
DILATOR CANAL MILEX (MISCELLANEOUS) IMPLANT
GAUZE 4X4 16PLY ~~LOC~~+RFID DBL (SPONGE) ×1 IMPLANT
GLOVE BIOGEL PI IND STRL 7.5 (GLOVE) ×1 IMPLANT
GLOVE SURG SS PI 7.0 STRL IVOR (GLOVE) ×1 IMPLANT
GLOVE SURG UNDER POLY LF SZ7 (GLOVE) ×1 IMPLANT
GOWN STRL REUS W/ TWL LRG LVL3 (GOWN DISPOSABLE) ×1 IMPLANT
GOWN STRL REUS W/ TWL XL LVL3 (GOWN DISPOSABLE) ×1 IMPLANT
PACK VAGINAL MINOR WOMEN LF (CUSTOM PROCEDURE TRAY) ×1 IMPLANT
PAD OB MATERNITY 4.3X12.25 (PERSONAL CARE ITEMS) ×1 IMPLANT
SET GENESYS HTA PROCERVA (MISCELLANEOUS) ×1 IMPLANT
SLEEVE SCD COMPRESS KNEE MED (STOCKING) ×2 IMPLANT
TOWEL GREEN STERILE FF (TOWEL DISPOSABLE) ×1 IMPLANT
UNDERPAD 30X36 HEAVY ABSORB (UNDERPADS AND DIAPERS) ×1 IMPLANT

## 2023-11-22 NOTE — H&P (Signed)
Obstetrics & Gynecology Surgical H&P   Date of Surgery: 11/22/2023    [facility]  Reason for Admission: scheduled hysteroscopy, novasure endometrial ablation for heavy and painful periods.   History of Present Illness: Nicole Lester is a 42 y.o. [phone] (Patient's last menstrual period was 10/25/2023 (approximate).), with the above CC.Her past medical history is significant for hypertension, tobacco, BMI 30s.   Patient got depo provera on 10/10 and finished up AUB which she had for the past 3 weeks and amenorrhea prior to that. Options d/w her and she desired to do an endometrial ablation.    ROS: A 12-point review of systems was performed and negative, except as stated in the above HPI.  OBGYN History: As per HPI. OB History  Gravida Para Term Preterm AB Living  10 3 3   3   SAB IAB Ectopic Multiple Live Births      3    # Outcome Date GA Lbr Len/2nd Weight Sex Type Anes PTL Lv  10 Gravida           9 Gravida           8 Gravida           7 Gravida           6 Gravida           5 Gravida           4 Gravida           3 Term           2 Term           1 Term             Obstetric Comments  SVD x 3   08/2023 pap and hpv negative.    Past Medical History: Past Medical History:  Diagnosis Date   Asthma    Beta thalassemia trait    Headache     Past Surgical History: Past Surgical History:  Procedure Laterality Date   DILATION AND CURETTAGE OF UTERUS      Family History:  Family History  Problem Relation Age of Onset   Breast cancer Maternal Aunt    Social History:  Social History   Socioeconomic History   Marital status: Married    Spouse name: Not on file   Number of children: Not on file   Years of education: Not on file   Highest education level: Not on file  Occupational History   Not on file  Tobacco Use   Smoking status: Every Day    Current packs/day: 0.20    Types: Cigarettes   Smokeless  tobacco: Never  Substance and Sexual Activity   Alcohol use: No   Drug use: No   Sexual activity: Not on file  Other Topics Concern   Not on file  Social History Narrative   Not on file   Social Drivers of Health   Financial Resource Strain: Not on File (07/11/2018)   Received from [employer]    Financial Resource Strain: 0  Food Insecurity: Not on File (07/11/2018)   Received from [employer] Insecurity    Food: 0  Transportation Needs: Not on File (07/11/2018)   Received from [employer] Needs    Transportation: 0  Physical Activity: Not on File (07/11/2018)   Received from [facility]   Physical Activity    Physical Activity: 0  Stress: Not on File (07/11/2018)   Received from [facility]   Stress    Stress: 0  Social Connections: Not on File (07/11/2018)   Received from [facility]   Social Connections    Social Connections and Isolation: 0  Intimate Partner Violence: Not on file    Allergy: Allergies  Allergen Reactions   Egg-Derived Products Anaphylaxis   Bactrim [Sulfamethoxazole-Trimethoprim] Nausea And Vomiting   Hydrocodone     Altered mental state    Oxycodone Nausea And Vomiting    Altered mental state   Boric Acid Hives and Rash    Current Outpatient Medications: Medications Prior to Admission  Medication Sig Dispense Refill Last Dose/Taking   cetirizine (ZYRTEC) 10 MG tablet Take 10 mg by mouth at bedtime.   11/21/2023   acetaminophen (TYLENOL) 650 MG CR tablet Take 650 mg by mouth every 8 (eight) hours as needed for pain.   More than a month     Hospital Medications: Current Facility-Administered Medications  Medication Dose Route Frequency Provider Last Rate Last Admin   lactated ringers infusion   Intravenous Continuous Shelton Silvas, MD       scopolamine (TRANSDERM-SCOP) 1 MG/3DAYS 1.5 mg  1 patch Transdermal Q72H Shelton Silvas, MD   1.5 mg at 11/22/23 6213     Physical Exam:  Current Vital Signs 24h Vital Sign  Ranges  T 98.1 F (36.7 C) Temp  Avg: 98.1 F (36.7 C)  Min: 98.1 F (36.7 C)  Max: 98.1 F (36.7 C)  BP 118/86 BP  Min: 118/86  Max: 118/86  HR 77 Pulse  Avg: 77  Min: 77  Max: 77  RR 18 Resp  Avg: 18  Min: 18  Max: 18  SaO2 99 % Room Air SpO2  Avg: 99 %  Min: 99 %  Max: 99 %       24 Hour I/O Current Shift I/O  Time Ins Outs No intake/output data recorded. No intake/output data recorded.    Body mass index is 29.52 kg/m. General appearance: Well nourished, well developed female in no acute distress.  Cardiovascular: S1, S2 normal, no murmur, rub or gallop, regular rate and rhythm Respiratory:  Clear to auscultation bilateral. Normal respiratory effort Abdomen: positive bowel sounds and no masses, hernias; diffusely non tender to palpation, non distended Neuro/Psych:  Normal mood and affect.  Skin:  Warm and dry.  Extremities: no clubbing, cyanosis, or edema.   From 10/25 Pelvic exam:  Cervical exam performed in the presence of a chaperone EGBUS: wnl Vaginal vault: wnl Cervix:  wnl Bimanual: negative  Laboratory: UPT: negative Recent Labs  Lab 11/22/23 0827  WBC 4.8  HGB 11.2*  HCT 35.6*  PLT 291   No results for input(s): "NA", "K", "CL", "CO2", "BUN", "CREATININE", "CALCIUM", "PROT", "BILITOT", "ALKPHOS", "ALT", "AST", "GLUCOSE" in the last 168 hours.  Invalid input(s): "LABALBU" No results for input(s): "APTT", "INR", "PTT" in the last 168 hours.  Invalid input(s): "DRHAPTT" Recent Labs  Lab 11/22/23 0835  ABORH A POS Performed at [facility], [address] [place], [place] [address]     Imaging:  No new imaging Narrative & Impression  CLINICAL DATA:  heavy and painful periods   EXAM: ULTRASOUND OF PELVIS   TECHNIQUE: Transabdominal and transvaginalultrasound examination of the pelvis was performed including evaluation of the uterus, ovaries, adnexal regions, and pelvic cul-de-sac.   COMPARISON:  None Available.    FINDINGS: Uterusanteverted, 9 x 7 x 6 cm. The endometrium unremarkable,  1.1 cm. The uterine cavity is empty. There are no uterine masses.   Right ovary   Unremarkable, 3.8 x 3.4 x 1.4 cm.   Left ovary   Unremarkable, 3.6 x 3.5 x 2.1 cm.   Images of the adnexae demonstrated no masses or fluid collections. Ovaries demonstrate blood flow with color Doppler.   IMPRESSION: Unremarkable examination of the pelvis.     Electronically Signed   By: Layla Maw M.D.   On: 08/01/2023 19:57    Assessment: Nicole Lester is a 42 y.o. [phone] (Patient's last menstrual period was 10/25/2023 (approximate).) here for hysteroscopy, d&c, novasure ablation.  Plan:  I also d/w her that I don't anticipate needing to remove any polyps or fibroids but will do so if can be done safely prior to the ablation, which she is amenable to.   Cornelia Copa MD Attending Center for [facility])

## 2023-11-22 NOTE — Brief Op Note (Signed)
11/22/2023  11:37 AM  PATIENT:  Nicole Lester  42 y.o. female  PRE-OPERATIVE DIAGNOSIS:  Menorrhagia, Dysmenorrhea  POST-OPERATIVE DIAGNOSIS:  Menorrhagia, Dysmenorrhea. Uterine septum  PROCEDURE:  Hysteroscopy, dilation and curettage  SURGEON:  Surgeons and Role:    [place] Bing, MD - Primary  ASSISTANTS: none   ANESTHESIA:   general and paracervical block  EBL:  0 mL   FLUID DEFICIT: 70mL  BLOOD ADMINISTERED:none  DRAINS: none   LOCAL MEDICATIONS USED:  LIDOCAINE   SPECIMEN:  endometrial curettings  DISPOSITION OF SPECIMEN:  PATHOLOGY  COUNTS:  YES  TOURNIQUET:  * No tourniquets in log *  DICTATION: .Note written in EPIC  PLAN OF CARE: Discharge to home after PACU  PATIENT DISPOSITION:  PACU - hemodynamically stable.   Delay start of Pharmacological VTE agent (>24hrs) due to surgical blood loss or risk of bleeding: not applicable  Cornelia Copa MD Attending Center for [facility] (Faculty Practice) 11/22/2023 Time: 1140

## 2023-11-22 NOTE — Anesthesia Procedure Notes (Signed)
Procedure Name: LMA Insertion Date/Time: 11/22/2023 11:14 AM  Performed by: Shelton Silvas, MDPre-anesthesia Checklist: Patient identified, Patient being monitored, Timeout performed, Emergency Drugs available and Suction available Patient Re-evaluated:Patient Re-evaluated prior to induction Oxygen Delivery Method: Circle system utilized Preoxygenation: Pre-oxygenation with 100% oxygen Induction Type: IV induction Ventilation: Mask ventilation without difficulty LMA: LMA inserted LMA Size: 4.0 Tube type: Oral Number of attempts: 1 Placement Confirmation: positive ETCO2 and breath sounds checked- equal and bilateral Tube secured with: Tape Dental Injury: Teeth and Oropharynx as per pre-operative assessment

## 2023-11-22 NOTE — Telephone Encounter (Incomplete)
***   Cornelia Copa MD Attending Center for [employer] (Faculty Practice) 11/22/2023 Time: [phone]

## 2023-11-22 NOTE — Transfer of Care (Signed)
Immediate Anesthesia Transfer of Care Note  Patient: Nicole Lester  Procedure(s) Performed: DILATATION & CURETTAGE/HYSTEROSCOPY WITH NOVASURE ABLATION W/ GENERAL AND PARACERVICAL BLOCK (Vagina )  Patient Location: PACU  Anesthesia Type:General  Level of Consciousness: drowsy  Airway & Oxygen Therapy: Patient Spontanous Breathing and Patient connected to face mask oxygen  Post-op Assessment: Report given to RN and Post -op Vital signs reviewed and stable  Post vital signs: Reviewed and stable  Last Vitals:  Vitals Value Taken Time  BP 110/59 11/22/23 1148  Temp    Pulse 80 11/22/23 1154  Resp 19 11/22/23 1154  SpO2 93 % 11/22/23 1154  Vitals shown include unfiled device data.  Last Pain:  Vitals:   11/22/23 0831  PainSc: 0-No pain      Patients Stated Pain Goal: 0 (11/22/23 0831)  Complications: No notable events documented.

## 2023-11-22 NOTE — Anesthesia Preprocedure Evaluation (Addendum)
Anesthesia Evaluation  Patient identified by MRN, date of birth, ID band Patient awake    Reviewed: Allergy & Precautions, NPO status , Patient's Chart, lab work & pertinent test results  Airway Mallampati: I  TM Distance: >3 FB Neck ROM: Full    Dental  (+) Teeth Intact, Dental Advisory Given   Pulmonary asthma , Current Smoker and Patient abstained from smoking.   breath sounds clear to auscultation       Cardiovascular hypertension,  Rhythm:Regular Rate:Normal     Neuro/Psych  Headaches  negative psych ROS   GI/Hepatic negative GI ROS, Neg liver ROS,,,  Endo/Other  negative endocrine ROS    Renal/GU negative Renal ROS     Musculoskeletal negative musculoskeletal ROS (+)    Abdominal   Peds  Hematology  (+) Blood dyscrasia, anemia   Anesthesia Other Findings   Reproductive/Obstetrics                             Anesthesia Physical Anesthesia Plan  ASA: 2  Anesthesia Plan: General   Post-op Pain Management: Tylenol PO (pre-op)* and Toradol IV (intra-op)*   Induction: Intravenous  PONV Risk Score and Plan: 3 and Dexamethasone, Midazolam and Ondansetron  Airway Management Planned: LMA  Additional Equipment: None  Intra-op Plan:   Post-operative Plan: Extubation in OR  Informed Consent: I have reviewed the patients History and Physical, chart, labs and discussed the procedure including the risks, benefits and alternatives for the proposed anesthesia with the patient or authorized representative who has indicated his/her understanding and acceptance.     Dental advisory given  Plan Discussed with:   Anesthesia Plan Comments:        Anesthesia Quick Evaluation

## 2023-11-22 NOTE — Anesthesia Postprocedure Evaluation (Signed)
Anesthesia Post Note  Patient: Dailany Moccia  Procedure(s) Performed: DILATATION & CURETTAGE/HYSTEROSCOPY AND PERICERVICAL BLOCK (Vagina )     Patient location during evaluation: PACU Anesthesia Type: General Level of consciousness: awake and alert Pain management: pain level controlled Vital Signs Assessment: post-procedure vital signs reviewed and stable Respiratory status: spontaneous breathing, nonlabored ventilation, respiratory function stable and patient connected to nasal cannula oxygen Cardiovascular status: blood pressure returned to baseline and stable Postop Assessment: no apparent nausea or vomiting Anesthetic complications: no  No notable events documented.  Last Vitals:  Vitals:   11/22/23 1200 11/22/23 1215  BP: 107/66 (!) 128/91  Pulse: 81 79  Resp: (!) 24 (!) 22  Temp:    SpO2: 93% 98%    Last Pain:  Vitals:   11/22/23 1215  PainSc: 0-No pain                 Shelton Silvas

## 2023-11-22 NOTE — Op Note (Incomplete)
Operative Note   11/22/2023  PRE-OP DIAGNOSIS: ***   POST-OP DIAGNOSIS: ***  SURGEON: Surgeons and Role:    [place] Bing, MD - Primary  ASSISTANT: ***  PROCEDURE:  Suction dilation and curettage***  ANESTHESIA: Monitor Anesthesia Care and paracervical block***  ESTIMATED BLOOD LOSS: ***mL  DRAINS: ***  TOTAL IV FLUIDS: {MEDS; IV FLUIDS OR:22148}  SPECIMENS: products of conception to pathology***  VTE PROPHYLAXIS: SCDs to the bilateral lower extremities***  ANTIBIOTICS: Doxycycline 100mg  IV x 1 pre op***  COMPLICATIONS: none  DISPOSITION: PACU - hemodynamically stable.  CONDITION: stable  BLOOD TYPE: A POS Performed at [facility], [address] [place], [place] [address] . Rhogam given:{YES/NO/NOT APPLICABLE:20182}***  FINDINGS: Exam under anesthesia revealed *** week sized uterus with no masses and bilateral adnexa without masses or fullness. Necrotic appearing products of conception were seen, with gritty texture in all four quadrants. Patient sounded to ***cm. ***  PROCEDURE IN DETAIL:  After informed consent was obtained, the patient was taken to the operating room where anesthesia was obtained without difficulty. The patient was positioned in the dorsal lithotomy position in [place] stirrups. The patient was examined under anesthesia, with the above noted findings.  The bi-valved speculum was placed inside the patient's vagina, and the the anterior lip of the cervix was seen and grasped with the tenaculum.  A paracervical block was achieved with 20mL of 1% lidocaine*** and then the cervix was progressively dilated to a *** French-Pratt dilator.  The suction was then calibrated to and connected to the number {NUMBER 1-10:22536} cannula, which was then introduced with the above noted findings. A gentle curettage was done at the end and yield no products of conception.   The suction was then done one more time to remove any remaining curettage material.    Excellent hemostasis was noted, and all instruments were removed, with excellent hemostasis noted throughout.  She was then taken out of dorsal lithotomy. The patient tolerated the procedure well.  Sponge, lap and instrument counts were correct x2.  The patient was taken to recovery room in excellent condition.  Cornelia Copa MD Attending Center for [employer] [facility])

## 2023-11-23 ENCOUNTER — Encounter (HOSPITAL_COMMUNITY): Payer: Self-pay | Admitting: Obstetrics and Gynecology

## 2023-11-23 LAB — SURGICAL PATHOLOGY

## 2023-12-13 ENCOUNTER — Other Ambulatory Visit: Payer: Self-pay

## 2023-12-13 ENCOUNTER — Ambulatory Visit: Payer: Medicaid Other | Admitting: Obstetrics and Gynecology

## 2023-12-13 VITALS — BP 134/88 | HR 84 | Wt 172.0 lb

## 2023-12-13 DIAGNOSIS — N946 Dysmenorrhea, unspecified: Secondary | ICD-10-CM

## 2023-12-13 DIAGNOSIS — N920 Excessive and frequent menstruation with regular cycle: Secondary | ICD-10-CM | POA: Diagnosis not present

## 2023-12-13 DIAGNOSIS — Q5128 Other doubling of uterus, other specified: Secondary | ICD-10-CM

## 2023-12-13 MED ORDER — SLYND 4 MG PO TABS: 1.0000 | ORAL_TABLET | Freq: Every day | ORAL | 3 refills | Status: DC

## 2023-12-13 NOTE — Progress Notes (Signed)
Obstetrics and Gynecology Visit Return Patient Evaluation  Appointment Date: 12/13/2023  Primary Care Provider: [facility], [place]  OBGYN Clinic: [facility]  Chief Complaint: routine post op  History of Present Illness:  Nicole Lester is a 43 y.o. P3 here for the above CC. Her past medical history is significant for uterine septum, hypertension, tobacco, BMI 30s.   She is s/p 12/30 hysteroscopy d&c. Novasure ablation to be done but uterine septum discovered at the time of the hysteroscopy. Negative hysteroscopy findings and final pathology showed inactive endometrium. Last depo provera was on 10/10.  Patient having occasional spotting recently and believes she had a period recently.   Review of Systems: as noted in the History of Present Illness.  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  Physical Exam:  BP 134/88   Pulse 84   Wt 172 lb (78 kg)   LMP 10/25/2023 (Approximate)   BMI 29.52 kg/m  Body mass index is 29.52 kg/m. General appearance: Well nourished, well developed female in no acute distress.  Neuro/Psych:  Normal mood and affect.   Assessment: patient stable  Plan:  1. 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 Pt to start slynd continuously and consider HTA  2. Dysmenorrhea  3. Menorrhagia with regular cycle   RTC: 3 months   Cornelia Copa MD Attending Center for [employer] [facility] [place])

## 2024-01-20 ENCOUNTER — Encounter: Payer: Self-pay | Admitting: Obstetrics and Gynecology

## 2024-05-15 ENCOUNTER — Ambulatory Visit (HOSPITAL_COMMUNITY)
Admission: EM | Admit: 2024-05-15 | Discharge: 2024-05-15 | Disposition: A | Discharge status: Home / Self Care | Attending: Emergency Medicine | Admitting: Emergency Medicine

## 2024-05-15 ENCOUNTER — Other Ambulatory Visit: Payer: Self-pay

## 2024-05-15 ENCOUNTER — Encounter (HOSPITAL_COMMUNITY): Payer: Self-pay | Admitting: *Deleted

## 2024-05-15 DIAGNOSIS — M549 Dorsalgia, unspecified: Secondary | ICD-10-CM

## 2024-05-15 MED ORDER — CYCLOBENZAPRINE HCL 10 MG PO TABS: 10.0000 mg | ORAL_TABLET | Freq: Two times a day (BID) | ORAL | 0 refills | Status: AC | PRN

## 2024-05-15 MED ORDER — MELOXICAM 7.5 MG PO TABS: ORAL_TABLET | ORAL | 0 refills | Status: AC

## 2024-05-15 NOTE — ED Provider Notes (Signed)
 [facility]    CSN: [id number] Arrival date & time: 05/15/24  9049      History   Chief Complaint Chief Complaint  Patient presents with   upper back pain    HPI Nicole Lester is a 43 y.o. female.  7 day history of upper back pain. Was located between the shoulder blades.  Injured while moving apartments. She used a bath soak and no longer has any pain. No current neck, back, head pain. No numbness or tingling in the hands or feet. She needs a note for return to work.  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 11/22/2023   Dysmenorrhea 07/18/2023   Menorrhagia with regular cycle 07/18/2023   Transient hypertension 07/18/2023    Past Surgical History:  Procedure Laterality Date   DILATION AND CURETTAGE OF UTERUS     DILITATION & CURRETTAGE/HYSTROSCOPY WITH NOVASURE ABLATION N/A 11/22/2023   Procedure: DILATATION & CURETTAGE/HYSTEROSCOPY AND PERICERVICAL BLOCK;  Surgeon: Izell Harari, MD;  Location: [facility] OR;  Service: Gynecology;  Laterality: N/A;  rep to cover    OB History     Gravida  10   Para  3   Term  3   Preterm      AB      Living  3      SAB      IAB      Ectopic      Multiple      Live Births  3        Obstetric Comments  SVD x 3          Home Medications    Prior to Admission medications   Medication Sig Start Date End Date Taking? Authorizing Provider  acetaminophen  (TYLENOL ) 650 MG CR tablet Take 650 mg by mouth every 8 (eight) hours as needed for pain.   Yes [provider]  cetirizine (ZYRTEC) 10 MG tablet Take 10 mg by mouth at bedtime. 10/05/23  Yes [provider]  cyclobenzaprine (FLEXERIL) 10 MG tablet Take 1 tablet (10 mg total) by mouth 2 (two) times daily as needed for muscle spasms. 05/15/24  Yes Rayce Brahmbhatt, Asberry, PA-C  meloxicam (MOBIC) 7.5 MG  tablet 1 or 2 tablets daily with food 05/15/24  Yes Tierany Appleby, PA-C  Heating Pads PADS 1 Pad by Does not apply route as needed. Patient not taking: Reported on 12/13/2023 11/22/23   Izell Harari, MD    Family History Family History  Problem Relation Age of Onset   Breast cancer Maternal Aunt     Social History Social History   Tobacco Use   Smoking status: Every Day    Current packs/day: 0.20    Types: Cigarettes   Smokeless tobacco: Never  Substance Use Topics   Alcohol use: No   Drug use: No     Allergies   Egg-derived products, Bactrim [sulfamethoxazole-trimethoprim], Hydrocodone, Oxycodone, and Boric acid   Review of Systems Review of Systems As per HPI  Physical Exam Triage Vital Signs ED Triage Vitals  Encounter Vitals Group     BP 05/15/24 1059 (!) 156/91     Girls Systolic BP Percentile --      Girls Diastolic BP Percentile --      Boys Systolic BP Percentile --      Boys Diastolic BP  Percentile --      Pulse Rate 05/15/24 1059 76     Resp 05/15/24 1059 20     Temp 05/15/24 1059 98.8 F (37.1 C)     Temp src --      SpO2 05/15/24 1059 100 %     Weight --      Height --      Head Circumference --      Peak Flow --      Pain Score 05/15/24 1055 0     Pain Loc --      Pain Education --      Exclude from Growth Chart --    No data found.  Updated Vital Signs BP (!) 156/91   Pulse 76   Temp 98.8 F (37.1 C)   Resp 20   LMP 11/24/2023   SpO2 100%   Visual Acuity Right Eye Distance:   Left Eye Distance:   Bilateral Distance:    Right Eye Near:   Left Eye Near:    Bilateral Near:     Physical Exam Vitals and nursing note reviewed.  Constitutional:      General: She is not in acute distress. HENT:     Mouth/Throat:     Mouth: Mucous membranes are moist.     Pharynx: Oropharynx is clear.   Eyes:     Extraocular Movements: Extraocular movements intact.     Conjunctiva/sclera: Conjunctivae normal.     Pupils: Pupils are  equal, round, and reactive to light.    Cardiovascular:     Rate and Rhythm: Normal rate and regular rhythm.     Pulses: Normal pulses.     Heart sounds: Normal heart sounds.  Pulmonary:     Effort: Pulmonary effort is normal.     Breath sounds: Normal breath sounds.   Musculoskeletal:        General: No swelling, tenderness or deformity. Normal range of motion.     Cervical back: Normal range of motion. No rigidity or tenderness.   Skin:    General: Skin is warm and dry.     Findings: No bruising.   Neurological:     General: No focal deficit present.     Mental Status: She is alert and oriented to person, place, and time.     Cranial Nerves: Cranial nerves 2-12 are intact. No cranial nerve deficit.     Sensory: Sensation is intact.     Motor: Motor function is intact. No weakness.     Coordination: Coordination is intact.     Gait: Gait is intact.     Deep Tendon Reflexes: Reflexes are normal and symmetric.     Comments: Strength 5/5. Sensation intact throughout      UC Treatments / Results  Labs (all labs ordered are listed, but only abnormal results are displayed) Labs Reviewed - No data to display  EKG   Radiology No results found.  Procedures Procedures (including critical care time)  Medications Ordered in UC Medications - No data to display  Initial Impression / Assessment and Plan / UC Course  I have reviewed the triage vital signs and the nursing notes.  Pertinent labs & imaging results that were available during my care of the patient were reviewed by me and considered in my medical decision making (see chart for details).    Normal exam, stable vitals Pain has resolved. No symptoms today.  Work note is provided for return today with no restrictions. Have sent mobic  and flexeril to use if needed and per patient request  No questions at this time  Final Clinical Impressions(s) / UC Diagnoses   Final diagnoses:  Upper back pain      Discharge Instructions      You can take the muscle relaxer FLEXERIL twice daily. If the medication makes you drowsy, take only at bed time.  MOBIC once or twice daily for pain     ED Prescriptions     Medication Sig Dispense Auth. Provider   meloxicam (MOBIC) 7.5 MG tablet 1 or 2 tablets daily with food 30 tablet Katura Eatherly, PA-C   cyclobenzaprine (FLEXERIL) 10 MG tablet Take 1 tablet (10 mg total) by mouth 2 (two) times daily as needed for muscle spasms. 20 tablet Michala Deblanc, Asberry, PA-C      PDMP not reviewed this encounter.   Jeryl Asberry, PA-C 05/15/24 1159

## 2024-05-15 NOTE — ED Triage Notes (Signed)
 PT reports back pain between shoulder blades while moving.

## 2024-05-15 NOTE — Discharge Instructions (Signed)
 You can take the muscle relaxer FLEXERIL twice daily. If the medication makes you drowsy, take only at bed time.  MOBIC once or twice daily for pain

## 2024-08-04 ENCOUNTER — Other Ambulatory Visit: Payer: Self-pay | Admitting: Obstetrics and Gynecology

## 2024-08-04 DIAGNOSIS — Z1231 Encounter for screening mammogram for malignant neoplasm of breast: Secondary | ICD-10-CM

## 2024-08-22 ENCOUNTER — Ambulatory Visit: Admission: RE | Admit: 2024-08-22 | Discharge: 2024-08-22 | Disposition: A | Source: Ambulatory Visit

## 2024-08-22 DIAGNOSIS — Z1231 Encounter for screening mammogram for malignant neoplasm of breast: Secondary | ICD-10-CM

## 2024-08-23 ENCOUNTER — Other Ambulatory Visit: Payer: Self-pay | Admitting: Medical Genetics

## 2024-09-05 ENCOUNTER — Other Ambulatory Visit: Payer: Self-pay

## 2024-09-05 ENCOUNTER — Inpatient Hospital Stay: Attending: Hematology

## 2024-09-05 ENCOUNTER — Inpatient Hospital Stay

## 2024-09-05 DIAGNOSIS — Z803 Family history of malignant neoplasm of breast: Secondary | ICD-10-CM | POA: Diagnosis present

## 2024-09-05 DIAGNOSIS — Z1379 Encounter for other screening for genetic and chromosomal anomalies: Secondary | ICD-10-CM | POA: Diagnosis not present

## 2024-09-05 DIAGNOSIS — Z800 Family history of malignant neoplasm of digestive organs: Secondary | ICD-10-CM | POA: Diagnosis not present

## 2024-09-05 LAB — GENETIC SCREENING ORDER

## 2024-09-06 DIAGNOSIS — Z800 Family history of malignant neoplasm of digestive organs: Secondary | ICD-10-CM | POA: Insufficient documentation

## 2024-09-06 DIAGNOSIS — Z803 Family history of malignant neoplasm of breast: Secondary | ICD-10-CM | POA: Insufficient documentation

## 2024-09-06 NOTE — Progress Notes (Signed)
 REFERRING PROVIDER: Alec House, MD [address] [place],  [place] [address]  PRIMARY PROVIDER:  [facility], [place]  PRIMARY REASON FOR VISIT:  1. Family history of breast cancer   2. Family history of liver cancer   3. Family history of colon cancer      HISTORY OF PRESENT ILLNESS:   Nicole Lester, a 43 y.o. female, was seen on 09/06/2024 for a [place] cancer genetics consultation at the request of Dr. Alec due to a family history of breast cancer.  Nicole Lester presents to clinic today to discuss the possibility of a hereditary predisposition to cancer, genetic testing, and to further clarify her future cancer risks, as well as potential cancer risks for family members.    Nicole Lester is a 43 y.o. female with no personal history of cancer.    CANCER HISTORY:  Oncology History   No history exists.    RELEVANT MEDICAL HISTORY AND RISK FACTORS:  Menarche was at age 7.  First live birth at age 24.  OCP use for approximately 2 years.  Ovaries intact: yes.  Hysterectomy: yes.  Menopausal status: perimenopausal.  HRT use: 0 years. Colonoscopy: no; not examined. Mammogram within the last year: yes. BIRADS 1 Number of breast biopsies: 0. Up to date with pelvic exams: yes. Any excessive radiation exposure in the past: no Other cancer screenings: no.   Past Medical History:  Diagnosis Date   Asthma    Beta thalassemia trait    Family history of breast cancer    Family history of colon cancer    Family history of liver cancer    Headache     Past Surgical History:  Procedure Laterality Date   DILATION AND CURETTAGE OF UTERUS     DILITATION & CURRETTAGE/HYSTROSCOPY WITH NOVASURE ABLATION N/A 11/22/2023   Procedure: DILATATION & CURETTAGE/HYSTEROSCOPY AND PERICERVICAL BLOCK;  Surgeon: Izell Harari, MD;  Location: [facility] OR;  Service: Gynecology;  Laterality: N/A;  rep to cover    Social History   Socioeconomic History   Marital status: Single    Spouse  name: Not on file   Number of children: Not on file   Years of education: Not on file   Highest education level: Not on file  Occupational History   Not on file  Tobacco Use   Smoking status: Every Day    Current packs/day: 0.20    Types: Cigarettes   Smokeless tobacco: Never  Substance and Sexual Activity   Alcohol use: No   Drug use: No   Sexual activity: Not on file  Other Topics Concern   Not on file  Social History Narrative   Not on file   Social Drivers of Health   Financial Resource Strain: Not on File (07/11/2018)   Received from [employer]    Financial Resource Strain: 0  Food Insecurity: Not on File (07/11/2018)   Received from [facility] Insecurity    Food: 0  Transportation Needs: Not on File (07/11/2018)   Received from [employer] Needs    Transportation: 0  Physical Activity: Not on File (07/11/2018)   Received from [facility]   Physical Activity    Physical Activity: 0  Stress: Not on File (07/11/2018)   Received from [facility]   Stress    Stress: 0  Social Connections: Not on File (07/11/2018)   Received from [facility]   Social Connections    Social Connections and Isolation: 0  FAMILY HISTORY:  We obtained a detailed, 4-generation family history.  Significant diagnoses are listed below: Family History  Problem Relation Age of Onset   Breast cancer Maternal Aunt 55       had a double mastectomy   Liver cancer Maternal Cousin 36 - 49   Colon cancer Maternal Cousin 39       unsure if got genetic testing    Nicole Lester has a living maternal aunt who is 60. She was diagnosed with breast cancer at age 53. She reports her aunt had a double mastectomy, and was unsure if the breast cancer was bilateral. She is not sure if this aunt has ever had genetic testing. Nicole Lester has a maternal cousin with liver cancer diagnosed at 62. Nicole Lester has another maternal cousin who was diagnosed with colon cancer at age 54 and died of  colon cancer at age 24. She is unsure if he ever had any genetic testing.  Nicole Lester is unaware of previous family history of genetic testing for hereditary cancer risks. Patient's maternal ancestors are of from [place], and paternal ancestors are of [place] descent. There is no reported Ashkenazi Jewish ancestry. There is no known consanguinity.  GENETIC COUNSELING ASSESSMENT: Nicole Lester is a 43 y.o. female with a family history of breast cancer and colon cancer. We discussed with Nicole Lester that the family history does not meet insurance or NCCN criteria for genetic testing, and, therefore, is not highly consistent with a familial hereditary cancer syndrome.  We feel she is at low risk to harbor  a gene mutation associated with such a condition. Despite not meeting insurance or NCCN criteria, Nicole Lester decided to pursue genetic testing. We, therefore, discussed and recommended the following at today's visit.   DISCUSSION: We discussed that, in general, most cancer is not inherited in families, but instead is sporadic or familial. Sporadic cancers occur by chance and typically happen at older ages (>50 years) as this type of cancer is caused by genetic changes acquired during an individual's lifetime. Some families have more cancers than would be expected by chance; however, the ages or types of cancer are not consistent with a known genetic mutation or known genetic mutations have been ruled out. This type of familial cancer is thought to be due to a combination of multiple genetic, environmental, hormonal, and lifestyle factors. While this combination of factors likely increases the risk of cancer, the exact source of this risk is not currently identifiable or testable.  We discussed that 5 - 10% of breast is hereditary. Most cases of hereditary breast cancer are associated with BRCA1 and BRCA2 genes, although there are other genes associated with hereditary breast cancer as well. Cancer risks and  management strategies are gene specific. We discussed that genetic testing can beneficial for several reasons, including clarifying specific cancer risks, identifying potential screening and risk-reduction options that may be appropriate, and to understand if other family members could be at risk for cancer and allow them to undergo genetic testing to clarify their cancer risks.   We reviewed the characteristics, features and inheritance patterns of hereditary cancer syndromes. We also discussed genetic testing, including the appropriate family members to test, the process of testing, insurance coverage and turn-around-time for results. We discussed the implications of a negative, positive, carrier and/or variant of uncertain significant result.   Nicole Lester  was offered the [facility] CancerNext + RNAinsight Panel which includes sequencing, rearrangement analysis, and RNA analysis for the following 40 genes:  APC, ATM, BAP1, BARD1, BMPR1A, BRCA1, BRCA2, BRIP1, CDH1, CDKN2A, CHEK2, FH, FLCN, MET, MLH1, MSH2, MSH6, MUTYH, NF1, NTHL1, PALB2, PMS2, PTEN, RAD51C, RAD51D, RPS20, SMAD4, STK11, TP53, TSC1, TSC2, and VHL (sequencing and deletion/duplication); AXIN2, HOXB13, MBD4, MSH3, POLD1 and POLE (sequencing only); EPCAM and GREM1 (deletion/duplication only).   Nicole Lester was also offered the [facility] CancerNext-Expanded + RNAinsight gene panel which  includes sequencing, rearrangement, and RNA analysis for the following 77 genes: AIP, ALK, APC, ATM, AXIN2, BAP1, BARD1, BMPR1A, BRCA1, BRCA2, BRIP1, CDC73, CDH1, CDK4, CDKN1B, CDKN2A, CEBPA, CHEK2, CTNNA1, DDX41, DICER1, ETV6, FH, FLCN, GATA2, LZTR1, MAX, MBD4, MEN1, MET, MLH1, MSH2, MSH3, MSH6, MUTYH, NF1, NF2, NTHL1, PALB2, PHOX2B, PMS2, POT1, PRKAR1A, PTCH1, PTEN, RAD51C, RAD51D, RB1, RET, RPS20, RUNX1, SDHA, SDHAF2, SDHB, SDHC, SDHD, SMAD4, SMARCA4, SMARCB1, SMARCE1, STK11, SUFU, TMEM127, TP53, TSC1, TSC2, VHL, and WT1 (sequencing and deletion/duplication); EGFR,  HOXB13, KIT, MITF, PDGFRA, POLD1, and POLE (sequencing only); EPCAM and GREM1 (deletion/duplication only).    Nicole Lester was informed of the benefits and limitations of each panel, including that expanded pan-cancer panels contain genes may not have clear management guidelines at this point in time. We also discussed that as the number of genes included on a panel increases, the chances of variants of uncertain significance increases. After considering the risks, benefits, and limitations, Nicole Lester provided informed consent to pursue genetic testing. Nicole Lester decided to pursue genetic testing for the CancerNext-Expanded + RNAinsight gene panel.  We discussed that some people do not want to undergo genetic testing due to fear of genetic discrimination.  The Genetic Information Nondiscrimination Act (GINA) was signed into federal law in 2008. GINA prohibits health insurers and most employers from discriminating against individuals based on genetic information (including the results of genetic tests and family history information). According to GINA, health insurance companies cannot consider genetic information to be a preexisting condition, nor can they use it to make decisions regarding coverage or rates. GINA also makes it illegal for most employers to use genetic information in making decisions about hiring, firing, promotion, or terms of employment. It is important to note that GINA does not offer protections for life insurance, disability insurance, or long-term care insurance. GINA does not apply to those in the [employer], those who work for companies with less than 15 employees, and new life insurance or long-term disability insurance policies.  Health status due to a cancer diagnosis is not protected under GINA. More information about GINA can be found by visiting [email or web address].   PLAN: After considering the risks, benefits, and limitations, Nicole Lester provided informed consent to pursue  genetic testing and the blood sample was sent to [facility] for analysis of the CancerNext-Expanded + RNA. Results should be available within approximately 2-3 weeks' time, at which point they will be disclosed by telephone to Nicole Lester, as will any additional recommendations warranted by these results. Nicole Lester will receive a summary of her genetic counseling visit and a copy of her results once available. This information will also be available in Epic.    RESOURCES PROVIDED:  Nicole Lester was provided with the following:  [facility] Billing information  [facility] Hereditary Cancer Testing Patient Guide [facility] Contact card   Lastly, we encouraged Ms. Mathe to remain in contact with cancer genetics annually so that we can continuously update the family history and inform her of any changes in cancer genetics and testing that may be of benefit for this family.   Ms.  Colbaugh's questions were answered to her satisfaction today. Our contact information was provided should additional questions or concerns arise. Thank you for the referral and allowing us  to share in the care of your patient.    Shadell Brenn R. Bluford, MS, [facility] Certified [email or web address] phone: [phone]  I personally spent a total of 60 minutes in the care of the patient today including preparing to see the patient, getting/reviewing separately obtained history, counseling and educating, and documenting clinical information in the EHR.  The patient was seen alone. Drs. Lanny Stalls, and/or Gudena were available for questions, if needed..    _______________________________________________________________________ For Office Staff:  Number of people involved in session: 1 Was an Intern/ student involved with case: no

## 2024-09-15 ENCOUNTER — Ambulatory Visit: Payer: Self-pay

## 2024-09-15 DIAGNOSIS — Z1379 Encounter for other screening for genetic and chromosomal anomalies: Secondary | ICD-10-CM | POA: Insufficient documentation

## 2024-09-15 DIAGNOSIS — Z800 Family history of malignant neoplasm of digestive organs: Secondary | ICD-10-CM

## 2024-09-15 DIAGNOSIS — Z803 Family history of malignant neoplasm of breast: Secondary | ICD-10-CM

## 2024-09-15 NOTE — Progress Notes (Signed)
 HPI:  Nicole Lester was previously seen in the [facility] due to a family history of breast and colon cancer and concerns regarding a hereditary predisposition to cancer. Please refer to our prior cancer genetics clinic note for more information regarding our discussion, assessment and recommendations, at the time. Nicole Lester's recent genetic test results were disclosed to her, as were recommendations warranted by these results. These results and recommendations are discussed in more detail below.  Nicole Lester is a 43 y.o. female with no personal history of cancer.    FAMILY HISTORY:  We obtained a detailed, 4-generation family history.  Significant diagnoses are listed below: Family History  Problem Relation Age of Onset   Breast cancer Maternal Aunt 90       had a double mastectomy   Liver cancer Maternal Cousin 52 - 49   Colon cancer Maternal Cousin 39       unsure if got genetic testing      Nicole Lester has a living maternal aunt who is 63. She was diagnosed with breast cancer at age 57. She reports her aunt had a double mastectomy, and was unsure if the breast cancer was bilateral. She is not sure if this aunt has ever had genetic testing. Nicole Lester has a maternal cousin with liver cancer diagnosed at 46. Nicole Lester has another maternal cousin who was diagnosed with colon cancer at age 28 and died of colon cancer at age 65. She is unsure if he ever had any genetic testing.   Nicole Lester is unaware of previous family history of genetic testing for hereditary cancer risks. Patient's maternal ancestors are of from [place], and paternal ancestors are of [place] descent. There is no reported Ashkenazi Jewish ancestry. There is no known consanguinity.  GENETIC TEST RESULTS: Genetic testing reported out on 09/14/2024 through the [facility]Exp[facility] + RNA[facility]ight gene panel which includes sequencing, rearrangement, and RNA analysis for the following 77 genes: AIP, ALK,  APC, ATM, AXIN2, BAP1, BARD1, BMPR1A, BRCA1, BRCA2, BRIP1, CDC73, CDH1, CDK4, CDKN1B, CDKN2A, CEBPA, CHEK2, CTNNA1, DDX41, DICER1, ETV6, FH, FLCN, GATA2, LZTR1, MAX, MBD4, MEN1, MET, MLH1, MSH2, MSH3, MSH6, MUTYH, NF1, NF2, NTHL1, PALB2, PHOX2B, PMS2, POT1, PRKAR1A, PTCH1, PTEN, RAD51C, RAD51D, RB1, RET, RPS20, RUNX1, SDHA, SDHAF2, SDHB, SDHC, SDHD, SMAD4, SMARCA4, SMARCB1, SMARCE1, STK11, SUFU, TMEM127, TP53, TSC1, TSC2, VHL, and WT1 (sequencing and deletion/duplication); EGFR, HOXB13, KIT, MITF, PDGFRA, POLD1, and POLE (sequencing only); EPCAM and GREM1 (deletion/duplication only).    This cancer panel found no pathogenic mutations any of the genes listed above. The test report has been scanned into EPIC and is located under the Molecular Pathology section of the Results Review tab.  A portion of the result report is included below for reference.     We discussed with Ms. Ciolino that because current genetic testing is not perfect, it is possible there may be a gene mutation in one of these genes that current testing cannot detect, but that chance is small.  We also discussed, that there could be another gene that has not yet been discovered, or that we have not yet tested, that is responsible for the cancer diagnoses in the family. It is also possible there is a hereditary cause for the cancer in the family that Ms. Cheek did not inherit and therefore was not identified in her testing.  Therefore, it is important to remain in touch with cancer genetics in the future so that we can continue to offer Nicole Lester  the most up to date genetic testing.   ADDITIONAL GENETIC TESTING: We discussed with Nicole Lester that her genetic testing was fairly extensive.  If there are genes identified to increase cancer risk that can be analyzed in the future, we would be happy to discuss and coordinate this testing at that time.    CANCER SCREENING RECOMMENDATIONS: Nicole Lester's test result is considered negative  (normal).  This means that we have not identified a hereditary cause for her personal and family history of breast and colon at this time. Most cancers happen by chance and this negative test suggests that her personal and family history of breast and colon cancer may fall into this category.    Possible reasons for Nicole Lester's negative genetic test include:  1. There may be a gene mutation in one of these genes that current testing methods cannot detect but that chance is small.  2. There could be another gene that has not yet been discovered, or that we have not yet tested, that is responsible for the cancer diagnoses in the family.  3.  There may be no hereditary risk for cancer in the family. The cancers in Nicole Lester and/or her family may be sporadic/familial or due to other genetic and environmental factors. 4. It is also possible there is a hereditary cause for the cancer in the family that Nicole Lester did not inherit.  Therefore, it is recommended she continue to follow the cancer management and screening guidelines provided by her primary healthcare provider. An individual's cancer risk and medical management are not determined by genetic test results alone. Overall cancer risk assessment incorporates additional factors, including personal medical history, family history, and any available genetic information that may result in a personalized plan for cancer prevention and surveillance.   An individual's cancer risk and medical management are not determined by genetic test results alone. Overall cancer risk assessment incorporates additional factors, including personal medical history, family history, and any available genetic information that may result in a personalized plan for cancer prevention and surveillance.  Based on Nicole Lester's personal and family of cancer, as well as her genetic test results, statistical model Tyrer-Cuzik and literature data were used to estimate her risk of  developing breast cancer. This estimates her lifetime risk of developing breast cancer to be approximately up to 10.3%.  The patient's lifetime breast cancer risk is a preliminary estimate based on available information using one of several models endorsed by the [employer] (NCCN).  The NCCN recommends consideration of breast MRI screening as an adjunct to mammography for patients at high risk (defined as 20% or greater lifetime risk). Please note that a woman's breast cancer risk changes over time. It may increase or decrease based on age and any changes to the personal and/or family medical history. The risks and recommendations listed above apply to this patient at this point in time. In the future, she may or may not be eligible for the same medical management strategies and, in some cases, other medical management strategies may become available to her. If she is interested in an updated breast cancer risk assessment at a later date, she can contact us .  The Tyrer-Cuzick model is one of multiple prediction models developed to estimate an individual's lifetime risk of developing breast cancer. The Tyrer-Cuzick model is endorsed by the [employer] (NCCN). This model includes many risk factors such as family history, endogenous estrogen exposure, and benign breast disease. The calculation  is highly-dependent on the accuracy of clinical data provided by the patient and can change over time. The Tyrer-Cuzick model may be repeated to reflect new information in her personal or family history in the future.   Due to her dense breasts, I spoke with Dr. Ebbie about a referral to his clinic and he recommended annual CEM for this patient. I spoke with Nicole Lester about this and she requested a referral to Dr. Gelene office.     RECOMMENDATIONS FOR FAMILY MEMBERS:   Since she did not inherit a identifiable mutation in a cancer predisposition gene  included on this panel, her children could not have inherited a known mutation from her in one of these genes. Individuals in this family might be at some increased risk of developing cancer, over the general population risk, simply due to the family history of cancer.  We recommended women in this family have a yearly mammogram beginning at age 39, or 78 years younger than the earliest onset of cancer, an annual clinical breast exam, and perform monthly breast self-exams. Women in this family should also have a gynecological exam as recommended by their primary provider. All family members should be referred for colonoscopy starting at age 53, or 63 years younger than the earliest onset of cancer. It is also possible there is a hereditary cause for the cancer in Ms. Sollars's family that she did not inherit and therefore was not identified in her.  Based on Nicole Lester's family history, we recommended her maternal aunt, who was diagnosed with breast cancer at age 34, have genetic counseling and testing. Nicole Lester will let us  know if we can be of any assistance in coordinating genetic counseling and/or testing for this family member.   FOLLOW-UP: Lastly, we discussed with Nicole Lester that cancer genetics is a rapidly advancing field and it is possible that new genetic tests will be appropriate for her and/or her family members in the future. We encouraged her to remain in contact with cancer genetics on an annual basis so we can update her personal and family histories and let her know of advances in cancer genetics that may benefit this family.   Our contact number was provided. Nicole Lester's questions were answered to her satisfaction, and she knows she is welcome to call us  at anytime with additional questions or concerns.   Warren Ahle, MS, [facility] Cancer Genetic Counselor [email or web address] [phone]

## 2024-09-18 ENCOUNTER — Telehealth: Payer: Self-pay

## 2024-09-18 NOTE — Telephone Encounter (Signed)
 I contacted Nicole Lester to discuss her genetic testing results. The test that was ordered was the [facility]-Expanded +RNA on 09/06/2024. No pathogenic variants were identified in the 77 genes analyzed. Detailed clinic note to follow.   The test report has been scanned into EPIC and is located under the Molecular Pathology section of the Results Review tab.  A portion of the result report is included below for reference.      Warren Ahle, MS, [facility] Cancer Genetic Counselor [email or web address] [phone]

## 2024-09-19 ENCOUNTER — Other Ambulatory Visit: Payer: Self-pay | Admitting: Obstetrics and Gynecology

## 2024-10-05 ENCOUNTER — Other Ambulatory Visit

## 2024-10-30 ENCOUNTER — Ambulatory Visit: Admitting: Obstetrics and Gynecology

## 2024-11-04 ENCOUNTER — Other Ambulatory Visit: Payer: Self-pay | Admitting: Obstetrics and Gynecology

## 2024-11-06 ENCOUNTER — Encounter: Payer: Self-pay | Admitting: Obstetrics and Gynecology

## 2024-11-06 ENCOUNTER — Other Ambulatory Visit: Payer: Self-pay

## 2024-11-06 MED ORDER — SLYND 4 MG PO TABS: 1.0000 | ORAL_TABLET | Freq: Every day | ORAL | 2 refills | Status: AC

## 2024-11-07 ENCOUNTER — Other Ambulatory Visit

## 2024-11-21 ENCOUNTER — Telehealth: Payer: Self-pay

## 2024-11-21 NOTE — Telephone Encounter (Signed)
 Fax request received for refill of Slynd  on 11/18/24. Chart review shows 3 month supply ordered on 11/06/24. Called pharmacy who states patient attempted to fill old prescription so automated refill request sent. Pharmacy to prepare refill of new prescription for patient to pick up.

## 2024-12-26 ENCOUNTER — Ambulatory Visit: Admitting: Obstetrics and Gynecology
